# Patient Record
Sex: Female | Born: 1990 | Race: White | Hispanic: No | Marital: Married | Smoking: Former smoker
Health system: Southern US, Community
[De-identification: ages and names within clinical notes are randomized; demographics above are authoritative.]

## PROBLEM LIST (undated history)

## (undated) DIAGNOSIS — B977 Papillomavirus as the cause of diseases classified elsewhere: Secondary | ICD-10-CM

## (undated) DIAGNOSIS — I10 Essential (primary) hypertension: Secondary | ICD-10-CM

## (undated) DIAGNOSIS — F329 Major depressive disorder, single episode, unspecified: Secondary | ICD-10-CM

## (undated) DIAGNOSIS — F419 Anxiety disorder, unspecified: Secondary | ICD-10-CM

## (undated) DIAGNOSIS — R87629 Unspecified abnormal cytological findings in specimens from vagina: Secondary | ICD-10-CM

## (undated) DIAGNOSIS — F32A Depression, unspecified: Secondary | ICD-10-CM

## (undated) HISTORY — PX: APPENDECTOMY: SHX54

## (undated) HISTORY — DX: Major depressive disorder, single episode, unspecified: F32.9

## (undated) HISTORY — DX: Anxiety disorder, unspecified: F41.9

## (undated) HISTORY — PX: WISDOM TOOTH EXTRACTION: SHX21

## (undated) HISTORY — DX: Essential (primary) hypertension: I10

## (undated) HISTORY — DX: Depression, unspecified: F32.A

## (undated) HISTORY — DX: Papillomavirus as the cause of diseases classified elsewhere: B97.7

## (undated) HISTORY — DX: Unspecified abnormal cytological findings in specimens from vagina: R87.629

---

## 2016-04-18 NOTE — L&D Delivery Note (Signed)
Delivery Note At 3:07 AM a viable female was delivered via Vaginal, Spontaneous Delivery (Presentation:LOA ).  APGAR:7,9. Weight pending.   Placenta status: Delivered intact with gentle traction.  Cord: 3 vessels with the following complications: None. Cord pH: Not collected  Anesthesia: Epidural Episiotomy: None Lacerations: None Suture Repair: N/A Est. Blood Loss (mL): 300  Mom to postpartum.  Baby to Couplet care / Skin to Skin.  Michelle Guerra, PGY-1 08/11/2016, 3:27 AM  Patient is a [id number] at [redacted]w[redacted]d who was admitted for IOL due to pre-e, but otherwise mostly an uncomplicated prenatal course.  She progressed with augmentation via Pit/AROM.  I was gloved and present for delivery in its entirety.  Second stage of labor progressed to SVD.  Mild decels during second stage noted.  Complications: none  Lacerations: none  EBL: 300cc  Michelle Guerra, CNM 4:26 PM

## 2016-07-04 LAB — OB RESULTS CONSOLE GBS: GBS: NEGATIVE

## 2016-07-11 ENCOUNTER — Other Ambulatory Visit (HOSPITAL_COMMUNITY)
Admission: RE | Admit: 2016-07-11 | Discharge: 2016-07-11 | Disposition: A | Discharge status: Home / Self Care | Payer: Medicaid Other | Source: Ambulatory Visit | Attending: Advanced Practice Midwife | Admitting: Advanced Practice Midwife

## 2016-07-11 ENCOUNTER — Ambulatory Visit (INDEPENDENT_AMBULATORY_CARE_PROVIDER_SITE_OTHER): Payer: Medicaid Other | Admitting: Advanced Practice Midwife

## 2016-07-11 ENCOUNTER — Encounter: Payer: Self-pay | Admitting: Advanced Practice Midwife

## 2016-07-11 ENCOUNTER — Encounter: Payer: Self-pay | Admitting: *Deleted

## 2016-07-11 VITALS — BP 127/85 | HR 112 | Ht 60.0 in | Wt 129.0 lb

## 2016-07-11 DIAGNOSIS — Z3483 Encounter for supervision of other normal pregnancy, third trimester: Secondary | ICD-10-CM | POA: Diagnosis not present

## 2016-07-11 DIAGNOSIS — Z23 Encounter for immunization: Secondary | ICD-10-CM

## 2016-07-11 DIAGNOSIS — N6012 Diffuse cystic mastopathy of left breast: Secondary | ICD-10-CM

## 2016-07-11 DIAGNOSIS — N6011 Diffuse cystic mastopathy of right breast: Secondary | ICD-10-CM

## 2016-07-11 DIAGNOSIS — O0930 Supervision of pregnancy with insufficient antenatal care, unspecified trimester: Secondary | ICD-10-CM | POA: Insufficient documentation

## 2016-07-11 DIAGNOSIS — O09899 Supervision of other high risk pregnancies, unspecified trimester: Secondary | ICD-10-CM | POA: Insufficient documentation

## 2016-07-11 DIAGNOSIS — O0933 Supervision of pregnancy with insufficient antenatal care, third trimester: Secondary | ICD-10-CM | POA: Diagnosis not present

## 2016-07-11 DIAGNOSIS — Z3689 Encounter for other specified antenatal screening: Secondary | ICD-10-CM | POA: Diagnosis not present

## 2016-07-11 NOTE — Patient Instructions (Signed)
Thinking About Waterbirth???  You must attend a Waterbirth class at [facility]  3rd Wednesday of every month from 7-9pm  Free  Register by calling [phone] or online at [email or web address]  Bring us the certificate from the class  Waterbirth supplies needed for [facility] Department patients:  Our practice has a Birth Pool in a Box tub at the hospital that you can borrow  You will need to purchase an accessory kit that has all needed supplies through [facility] ([phone]) or online $175.00  Or you can purchase the supplies separately: o Single-use disposable tub liner for Birth Pool in a Box (REGULAR size) o New garden hose labeled "lead-free", "suitable for drinking water", o Electric drain pump to remove water (We recommend 792 gallon per hour or greater pump.)  o  "non-toxic" OR "water potable" o Garden hose to remove the dirty water o Fish net o Bathing suit top (optional) o Long-handled mirror (optional)  [employer] sells tubs for ~ $120 if you would rather purchase your own tub.  They also sell accessories, liners.    Www.waterbirthsolutions.com for tub purchases and supplies  [employer] (www.thelaborladies.com) $275 for tub rental/set-up & take down/kit   [employer] information regarding doulas (labor support) who provide pool rentals:  Http://www.[facility]anc.org/MeetUs.htm   [employer] (www.thelaborladies.com)  Http://www.padanc.org/MeetUs.htm   Things that would prevent you from having a waterbirth:  Premature, <37wks  Previous cesarean birth  Presence of thick meconium-stained fluid  Multiple gestation (Twins, triplets, etc.)  Uncontrolled diabetes or gestational diabetes requiring medication  Hypertension  Heavy vaginal bleeding  Non-reassuring fetal heart rate  Active infection (MRSA, etc.)  If your labor has to be induced and induction method requires continuous  monitoring of the baby's heart rate  Other risks/issues identified by your obstetrical provider   

## 2016-07-11 NOTE — Progress Notes (Signed)
   PRENATAL VISIT NOTE  Subjective:  Michelle Guerra is a 26 y.o. G4P3003 at 4956w1d being seen today for initial prenatal visit.  She is currently monitored for the following issues for this low-risk pregnancy and has Supervision of normal pregnancy on her problem list.  Patient reports no complaints.  Contractions: Not present. Vag. Bleeding: None.  Movement: Present. Denies leaking of fluid.   The following portions of the patient's history were reviewed and updated as appropriate: allergies, current medications, past family history, past medical history, past social history, past surgical history and problem list. Problem list updated.  Objective:   Vitals:   07/11/16 0923 07/11/16 0927  BP: 127/85   Pulse: (!) 112   Weight: 129 lb (58.5 kg)   Height:  5' (1.524 m)    Fetal Status: Fetal Heart Rate (bpm): 145   Movement: Present     VS reviewed, nursing note reviewed,  Constitutional: well developed, well nourished, no distress HEENT: normocephalic CV: normal rate Pulm/chest wall: normal effort Breast Exam:  right breast normal with multiple small smooth round mobile masses c/w fibrocystic breasts, skin or nipple changes or axillary nodes, left breast normal without mass, skin or nipple changes or axillary nodes Abdomen: soft Neuro: alert and oriented x 3 Skin: warm, dry Psych: affect normal Pelvic exam: Cervix pink, visually closed, without lesion, scant white creamy discharge, vaginal walls and external genitalia normal Bimanual exam: Cervix 0/long/high, firm, anterior, neg CMT, uterus nontender, nonenlarged, adnexa without tenderness, enlargement, or mass  Assessment and Plan:  Pregnancy: G4P3003 at 5356w1d  1. Encounter for supervision of other normal pregnancy in third trimester  - Prenatal Profile - Culture, OB Urine - Cytology - PAP - Tdap vaccine greater than or equal to 7yo IM - US MFM OB COMP + 14 WK; Future  2. Late prenatal care  - US MFM OB COMP + 14 WK;  Future  3. Fibrocystic breast changes of both breasts --Pt not aware of breast cysts, never had diagnosis of fibrocystic breasts. No tenderness or other breast problems.   --Discussed with pt, recommend decreased caffeine.   Recommend regular breast self exams, pt to report if any suspicious or persistent changes.  Preterm labor symptoms and general obstetric precautions including but not limited to vaginal bleeding, contractions, leaking of fluid and fetal movement were reviewed in detail with the patient. Please refer to After Visit Summary for other counseling recommendations.  Return in about 2 weeks (around 07/25/2016).   Hurshel PartyLisa A Leftwich-Kirby, CNM

## 2016-07-12 LAB — CYTOLOGY - PAP
Chlamydia: NEGATIVE
DIAGNOSIS: NEGATIVE
NEISSERIA GONORRHEA: NEGATIVE

## 2016-07-12 LAB — PRENATAL PROFILE (SOLSTAS)
ANTIBODY SCREEN: NEGATIVE
BASOS ABS: 0 {cells}/uL (ref 0–200)
Basophils Relative: 0 %
EOS PCT: 1 %
Eosinophils Absolute: 166 cells/uL (ref 15–500)
HCT: 34.1 % — ABNORMAL LOW (ref 35.0–45.0)
HEMOGLOBIN: 12 g/dL (ref 11.7–15.5)
HIV 1&2 Ab, 4th Generation: NONREACTIVE
Hepatitis B Surface Ag: NEGATIVE
Lymphocytes Relative: 13 %
Lymphs Abs: 2158 cells/uL (ref 850–3900)
MCH: 31.1 pg (ref 27.0–33.0)
MCHC: 35.2 g/dL (ref 32.0–36.0)
MCV: 88.3 fL (ref 80.0–100.0)
MONOS PCT: 5 %
MPV: 10.6 fL (ref 7.5–12.5)
Monocytes Absolute: 830 cells/uL (ref 200–950)
Neutro Abs: 13446 cells/uL — ABNORMAL HIGH (ref 1500–7800)
Neutrophils Relative %: 81 %
Platelets: 346 10*3/uL (ref 140–400)
RBC: 3.86 MIL/uL (ref 3.80–5.10)
RDW: 13.9 % (ref 11.0–15.0)
Rh Type: POSITIVE
WBC: 16.6 10*3/uL — AB (ref 3.8–10.8)

## 2016-07-13 LAB — CULTURE, OB URINE

## 2016-07-14 ENCOUNTER — Other Ambulatory Visit: Payer: Medicaid Other

## 2016-07-14 DIAGNOSIS — O0930 Supervision of pregnancy with insufficient antenatal care, unspecified trimester: Secondary | ICD-10-CM

## 2016-07-15 ENCOUNTER — Ambulatory Visit (HOSPITAL_COMMUNITY)
Admission: RE | Admit: 2016-07-15 | Discharge: 2016-07-15 | Disposition: A | Discharge status: Home / Self Care | Payer: Medicaid Other | Source: Ambulatory Visit | Attending: Advanced Practice Midwife | Admitting: Advanced Practice Midwife

## 2016-07-15 ENCOUNTER — Other Ambulatory Visit: Payer: Self-pay | Admitting: Advanced Practice Midwife

## 2016-07-15 DIAGNOSIS — Z3687 Encounter for antenatal screening for uncertain dates: Secondary | ICD-10-CM

## 2016-07-15 DIAGNOSIS — O09293 Supervision of pregnancy with other poor reproductive or obstetric history, third trimester: Secondary | ICD-10-CM | POA: Insufficient documentation

## 2016-07-15 DIAGNOSIS — Z3A33 33 weeks gestation of pregnancy: Secondary | ICD-10-CM | POA: Insufficient documentation

## 2016-07-15 DIAGNOSIS — O0930 Supervision of pregnancy with insufficient antenatal care, unspecified trimester: Secondary | ICD-10-CM

## 2016-07-15 DIAGNOSIS — Z3689 Encounter for other specified antenatal screening: Secondary | ICD-10-CM

## 2016-07-15 DIAGNOSIS — O0933 Supervision of pregnancy with insufficient antenatal care, third trimester: Secondary | ICD-10-CM | POA: Diagnosis not present

## 2016-07-15 DIAGNOSIS — O09299 Supervision of pregnancy with other poor reproductive or obstetric history, unspecified trimester: Secondary | ICD-10-CM

## 2016-07-15 DIAGNOSIS — Z8632 Personal history of gestational diabetes: Secondary | ICD-10-CM

## 2016-07-15 DIAGNOSIS — Z3483 Encounter for supervision of other normal pregnancy, third trimester: Secondary | ICD-10-CM

## 2016-07-15 LAB — GLUCOSE TOLERANCE, 2 HOURS W/ 1HR
Glucose, 1 hour: 155 mg/dL
Glucose, 2 hour: 121 mg/dL (ref <140)
Glucose, Fasting: 78 mg/dL (ref 65–99)

## 2016-07-15 IMAGING — US US MFM OB COMP +14 WKS
1 series · 14 of 28 positions shown · non-contrast
Comparison: none

[Series 1: us mfm ob comp +14 wks · 82 acquisitions, 14 frames shown]
[im 4/82]
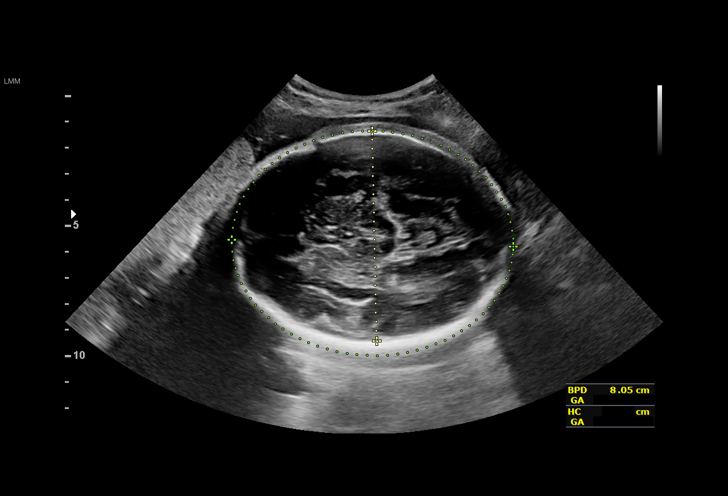
[im 10/82]
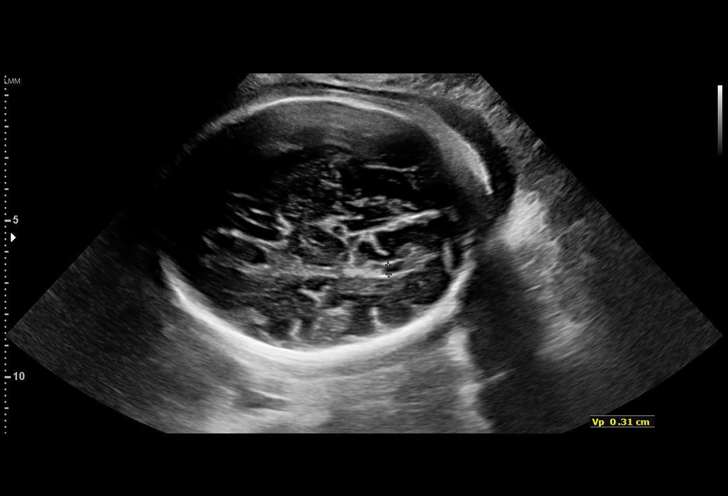
[im 16/82]
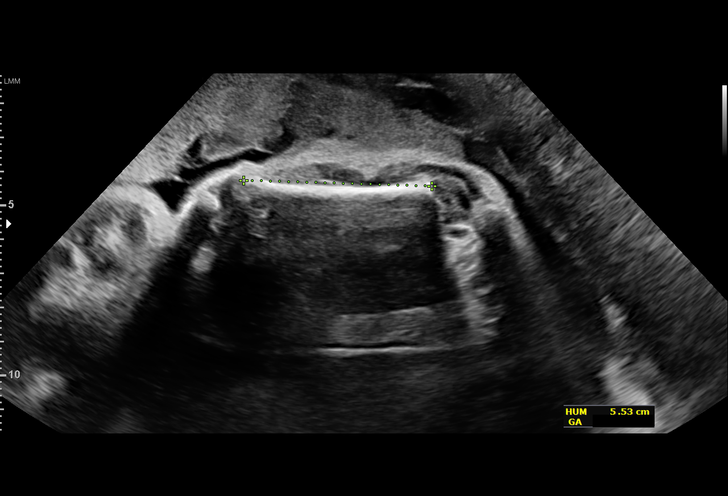
[im 22/82]
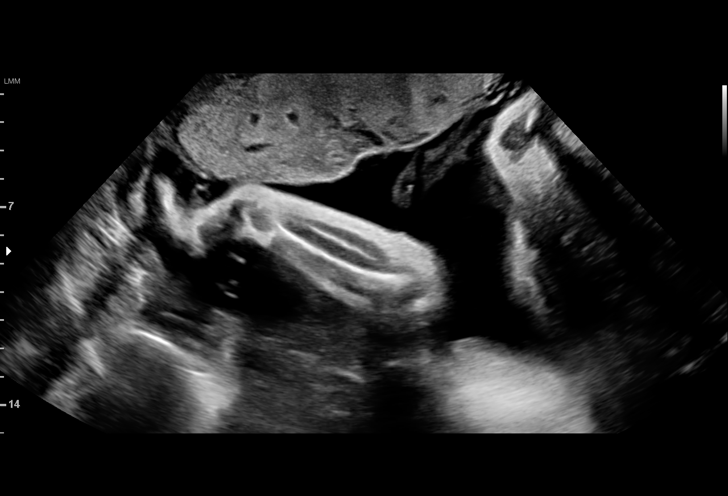
[im 28/82]
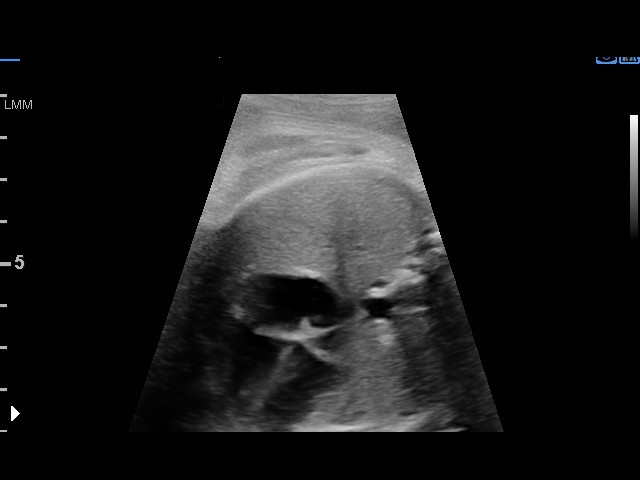
[im 34/82]
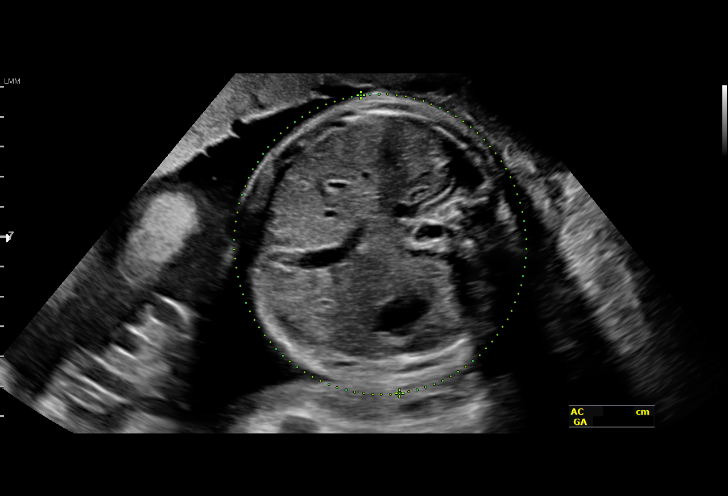
[im 40/82]
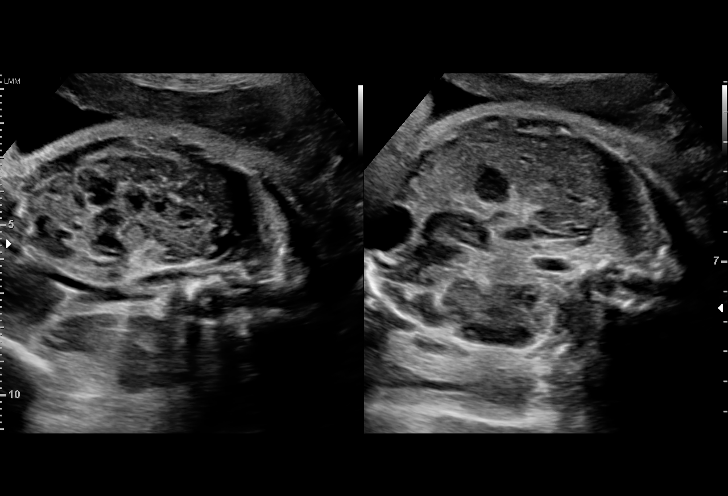
[im 46/82]
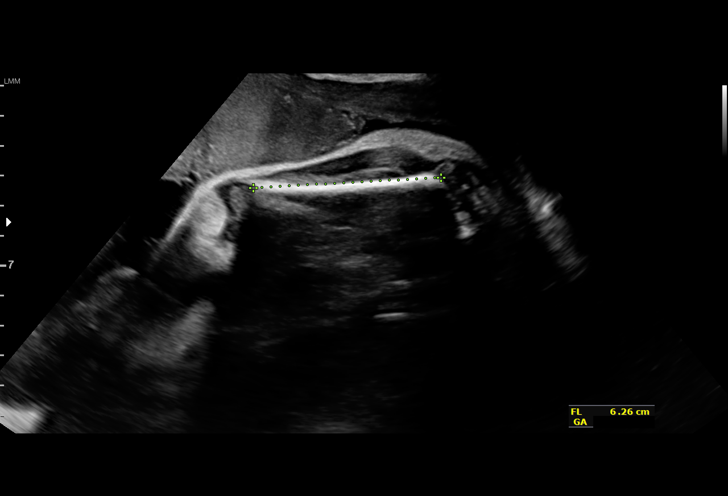
[im 52/82]
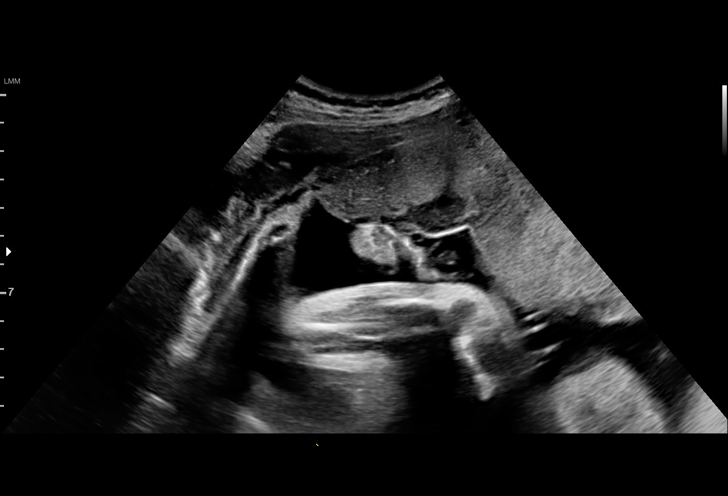
[im 58/82]
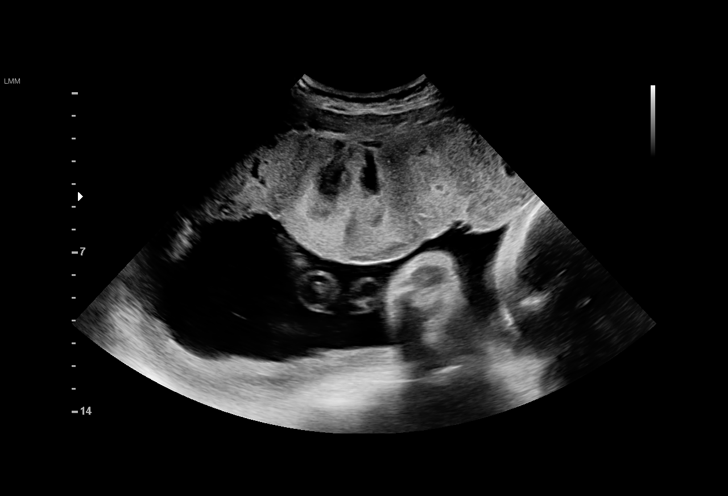
[im 64/82]
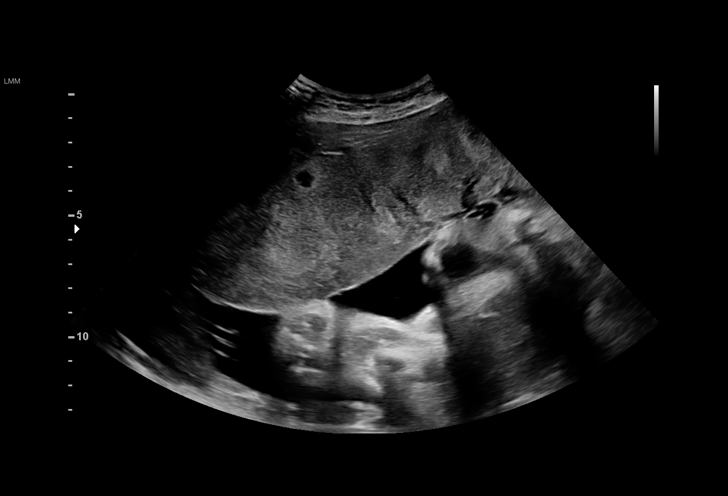
[im 70/82]
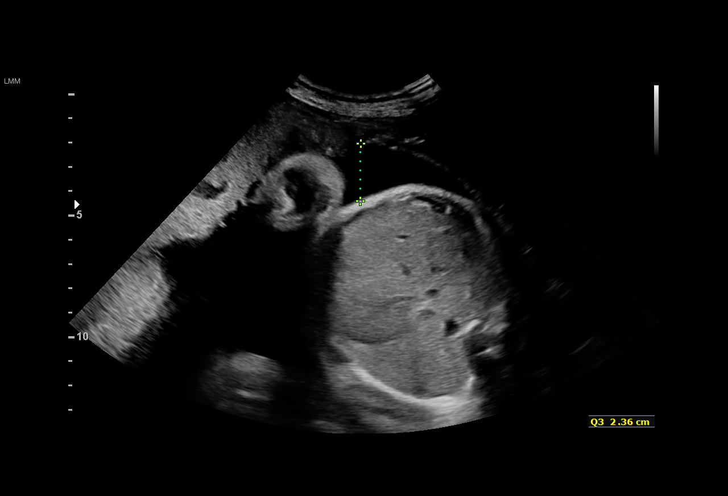
[im 76/82]
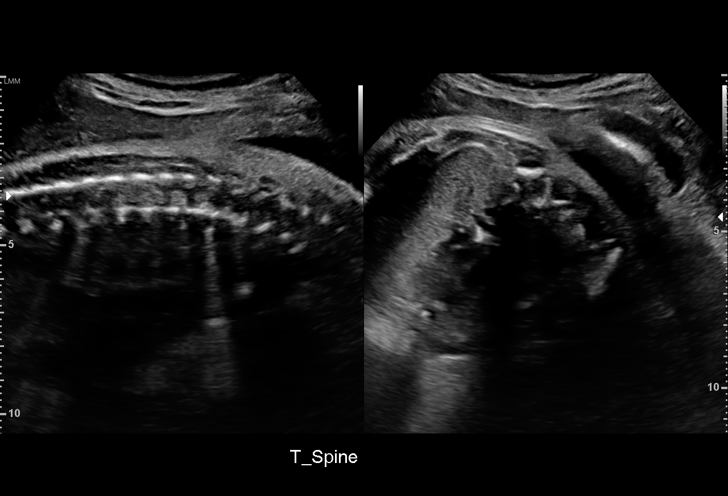
[im 82/82]
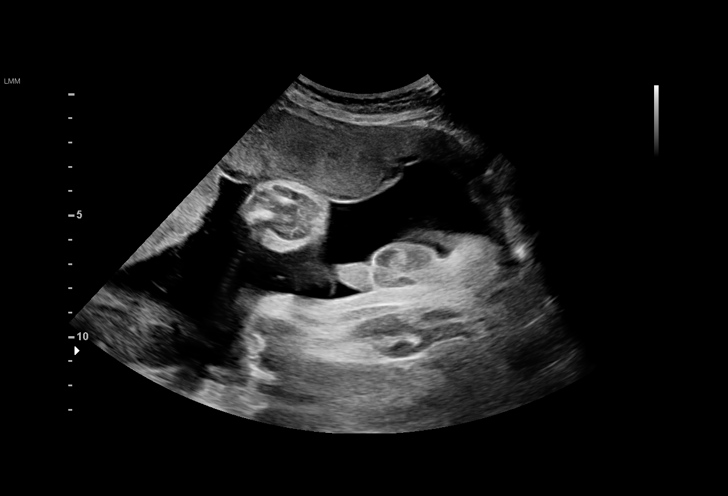

[14 of 28 positions shown; findings below may reference images not displayed]

Indications

33 weeks gestation of pregnancy
Encounter for fetal anatomic survey            [RB]
Encounter for uncertain dates                  [RB]
Poor obstetric history: Previous gestational   [RB]
diabetes (nl 2 hour GTT)
Late to prenatal care, third trimester         [RB]
Fetal Evaluation

Num Of Fetuses:     1
Fetal Heart         146
Rate(bpm):
Cardiac Activity:   Observed
Presentation:       Cephalic
Placenta:           Anterior, above cervical os
P. Cord Insertion:  Visualized, central

Amniotic Fluid
AFI FV:      Subjectively within normal limits

AFI Sum(cm)     %Tile       Largest Pocket(cm)
21.2            80

RUQ(cm)       RLQ(cm)       LUQ(cm)        LLQ(cm)
4.9
Biometry

BPD:        80  mm     G. Age:  32w 1d         15  %    CI:        70.64   %   70 - 86
FL/HC:      20.7   %   19.9 -
HC:      303.4  mm     G. Age:  33w 5d         24  %    HC/AC:      0.99       0.96 -
AC:      307.7  mm     G. Age:  34w 5d         87  %    FL/BPD:     78.4   %   71 - 87
FL:       62.7  mm     G. Age:  32w 3d         20  %    FL/AC:      20.4   %   20 - 24
HUM:      55.2  mm     G. Age:  32w 1d         35  %
CER:      40.1  mm     G. Age:  34w 1d         59  %
CM:        5.5  mm

Est. FW:    [RB]  gm          5 lb      68  %
Gestational Age

U/S Today:     33w 2d                                        EDD:   [DATE]
Best:          33w 2d    Det. By:   U/S ([DATE])           EDD:   [DATE]
Anatomy

Cranium:               Appears normal         Aortic Arch:            Not well visualized
Cavum:                 Appears normal         Ductal Arch:            Not well visualized
Ventricles:            Appears normal         Diaphragm:              Appears normal
Choroid Plexus:        Not well visualized    Stomach:                Appears normal, left
sided
Cerebellum:            Appears normal         Abdomen:                Appears normal
Posterior Fossa:       Appears normal         Abdominal Wall:         Appears nml (cord
insert, abd wall)
Nuchal Fold:           Not applicable (>20    Cord Vessels:           Appears normal (3
wks GA)                                        vessel cord)
Face:                  Orbits appear          Kidneys:                Appear normal
normal
Lips:                  Appears normal         Bladder:                Appears normal
Thoracic:              Appears normal         Spine:                  Limited views
appear normal
Heart:                 Appears normal         Upper Extremities:      Visualized
(4CH, axis, and situs
RVOT:                  Not well visualized    Lower Extremities:      Visualized
LVOT:                  Appears normal

Other:  Technically difficult due to advanced gestational age and fetal
position.  PATIENT DOES NOT WANT TO KNOW GENDER. Fetus
appears to be a male.
Cervix Uterus Adnexa

Cervix
Not visualized (advanced GA >[RB])

Uterus
No abnormality visualized.

Left Ovary
Not visualized.

Right Ovary
Not visualized.

Adnexa:       No abnormality visualized.
Impression

Single IUP at 33w 2d
Limited views of the fetal heart and spine obtained due to late
gestational age and fetal position
The remainder of the fetal anatomy appears normal
The estimated fetal weight is at the 68th %tile
Anterior placenta without previa
Normal amniotic fluid volume
Recommendations

Follow-up ultrasounds as clinically indicated.

## 2016-07-18 ENCOUNTER — Telehealth: Payer: Self-pay

## 2016-07-18 NOTE — Telephone Encounter (Signed)
Left message on pt's phone letting her know that she passed her GTT

## 2016-07-29 ENCOUNTER — Encounter: Payer: Self-pay | Admitting: Advanced Practice Midwife

## 2016-07-29 ENCOUNTER — Ambulatory Visit (INDEPENDENT_AMBULATORY_CARE_PROVIDER_SITE_OTHER): Payer: Medicaid Other | Admitting: Advanced Practice Midwife

## 2016-07-29 ENCOUNTER — Other Ambulatory Visit (HOSPITAL_COMMUNITY)
Admission: RE | Admit: 2016-07-29 | Discharge: 2016-07-29 | Disposition: A | Discharge status: Home / Self Care | Payer: Medicaid Other | Source: Ambulatory Visit | Attending: Advanced Practice Midwife | Admitting: Advanced Practice Midwife

## 2016-07-29 VITALS — BP 146/94 | HR 92 | Wt 131.0 lb

## 2016-07-29 DIAGNOSIS — O26899 Other specified pregnancy related conditions, unspecified trimester: Secondary | ICD-10-CM | POA: Diagnosis present

## 2016-07-29 DIAGNOSIS — O10913 Unspecified pre-existing hypertension complicating pregnancy, third trimester: Secondary | ICD-10-CM | POA: Diagnosis not present

## 2016-07-29 DIAGNOSIS — O0933 Supervision of pregnancy with insufficient antenatal care, third trimester: Secondary | ICD-10-CM

## 2016-07-29 DIAGNOSIS — N898 Other specified noninflammatory disorders of vagina: Secondary | ICD-10-CM

## 2016-07-29 DIAGNOSIS — O09893 Supervision of other high risk pregnancies, third trimester: Secondary | ICD-10-CM | POA: Diagnosis not present

## 2016-07-29 DIAGNOSIS — Z3A00 Weeks of gestation of pregnancy not specified: Secondary | ICD-10-CM | POA: Diagnosis not present

## 2016-07-29 DIAGNOSIS — O10919 Unspecified pre-existing hypertension complicating pregnancy, unspecified trimester: Secondary | ICD-10-CM

## 2016-07-29 DIAGNOSIS — O09899 Supervision of other high risk pregnancies, unspecified trimester: Secondary | ICD-10-CM

## 2016-07-29 DIAGNOSIS — Z3A33 33 weeks gestation of pregnancy: Secondary | ICD-10-CM

## 2016-07-29 NOTE — Progress Notes (Signed)
   PRENATAL VISIT NOTE  Subjective:  Michelle Guerra is a 26 y.o. G4P3003 at [redacted]w[redacted]d(changed based on anatomy [place] 3[place]) being seen today for ongoing prenatal care.  She is currently monitored for the following issues for this high-risk pregnancy and has Supervision of other high risk pregnancy, antepartum and Late prenatal care on her problem list.  Patient reports occasional contractions and pelvic pressure.  Contractions: Irreg. Vag. Bleeding: None.  Movement: Present. Denies leaking of fluid, HA, vision changes or epigastric pain.   The following portions of the patient's history were reviewed and updated as appropriate: allergies, current medications, past family history, past medical history, past social history, past surgical history and problem list. Problem list updated.  Has not started antenatal testing due to recent change in EDD.   Objective:   Vitals:   07/29/16 0824  BP: (!) 146/94  Pulse: 92  Weight: 131 lb (59.4 kg)    Fetal Status: Fetal Heart Rate (bpm): 155 Fundal Height: 34 cm Movement: Present  Presentation: Vertex  General:  Alert, oriented and cooperative. Patient is in no acute distress.  Skin: Skin is warm and dry. No rash noted.   Cardiovascular: Normal heart rate noted  Respiratory: Normal respiratory effort, no problems with respiration noted  Abdomen: Soft, gravid, appropriate for gestational age. Pain/Pressure: Present     Pelvic:  Cervical exam performed Dilation: Fingertip Effacement (%): 0 Station: Ballotable  Extremities: Normal range of motion.  Edema: None  Mental Status: Normal mood and affect. Normal behavior. Normal judgment and thought content.   NST reactive  Assessment and Plan:  Pregnancy: G4P3003 at 353w2d1. Chronic hypertension during pregnancy, antepartum  - CBC - Comp Met (CMET) - Protein / creatinine ratio, urine - [place] FOLLOW UP; Future - Discussed possibly needing to start BP meds. Pt wants to wait for now, which I  think is appropriate - Pre-E precautions.  2. Limited prenatal care in third trimester  - [place] OB FOLLOW UP; Future  3. [redacted] weeks gestation of pregnancy  - [place] OB FOLLOW UP; Future  4. Supervision of other high risk pregnancy, antepartum   Preterm labor symptoms and general obstetric precautions including but not limited to vaginal bleeding, contractions, leaking of fluid and fetal movement were reviewed in detail with the patient. Please refer to After Visit Summary for other counseling recommendations.  Return for Start twice weekly testing.   ViManya SilvasCNM

## 2016-07-29 NOTE — Progress Notes (Signed)
First BP reading on right arm 146/94. Repeat BP on left arm 144/101.

## 2016-07-29 NOTE — Patient Instructions (Signed)
Hypertension During Pregnancy °Hypertension, commonly called high blood pressure, is when the force of blood pumping through your arteries is too strong. Arteries are blood vessels that carry blood from the heart throughout the body. Hypertension during pregnancy can cause problems for you and your baby. Your baby may be born early (prematurely) or may not weigh as much as he or she should at birth. Very bad cases of hypertension during pregnancy can be life-threatening. °Different types of hypertension can occur during pregnancy. These include: °· Chronic hypertension. This happens when: °¨ You have hypertension before pregnancy and it continues during pregnancy. °¨ You develop hypertension before you are [redacted] weeks pregnant, and it continues during pregnancy. °· Gestational hypertension. This is hypertension that develops after the 20th week of pregnancy. °· Preeclampsia, also called toxemia of pregnancy. This is a very serious type of hypertension that develops only during pregnancy. It affects the whole body, and it can be very dangerous for you and your baby. °Gestational hypertension and preeclampsia usually go away within 6 weeks after your baby is born. Women who have hypertension during pregnancy have a greater chance of developing hypertension later in life or during future pregnancies. °What are the causes? °The exact cause of hypertension is not known. °What increases the risk? °There are certain factors that make it more likely for you to develop hypertension during pregnancy. These include: °· Having hypertension during a previous pregnancy or prior to pregnancy. °· Being overweight. °· Being older than age 40. °· Being pregnant for the first time or being pregnant with more than one baby. °· Becoming pregnant using fertilization methods such as IVF (in vitro fertilization). °· Having diabetes, kidney problems, or systemic lupus erythematosus. °· Having a family history of hypertension. °What are the  signs or symptoms? °Chronic hypertension and gestational hypertension rarely cause symptoms. Preeclampsia causes symptoms, which may include: °· Increased protein in your urine. Your health care provider will check for this at every visit before you give birth (prenatal visit). °· Severe headaches. °· Sudden weight gain. °· Swelling of the hands, face, legs, and feet. °· Nausea and vomiting. °· Vision problems, such as blurred or double vision. °· Numbness in the face, arms, legs, and feet. °· Dizziness. °· Slurred speech. °· Sensitivity to bright lights. °· Abdominal pain. °· Convulsions. °How is this diagnosed? °You may be diagnosed with hypertension during a routine prenatal exam. At each prenatal visit, you may: °· Have a urine test to check for high amounts of protein in your urine. °· Have your blood pressure checked. A blood pressure reading is recorded as two numbers, such as "120 over 80" (or 120/80). The first ("top") number is called the systolic pressure. It is a measure of the pressure in your arteries when your heart beats. The second ("bottom") number is called the diastolic pressure. It is a measure of the pressure in your arteries as your heart relaxes between beats. Blood pressure is measured in a unit called mm Hg. A normal blood pressure reading is: °¨ Systolic: below 120. °¨ Diastolic: below 80. °The type of hypertension that you are diagnosed with depends on your test results and when your symptoms developed. °· Chronic hypertension is usually diagnosed before 20 weeks of pregnancy. °· Gestational hypertension is usually diagnosed after 20 weeks of pregnancy. °· Hypertension with high amounts of protein in the urine is diagnosed as preeclampsia. °· Blood pressure measurements that stay above 160 systolic, or above 110 diastolic, are signs of severe preeclampsia. °  How is this treated? Treatment for hypertension during pregnancy varies depending on the type of hypertension you have and how  serious it is.  If you take medicines called ACE inhibitors to treat chronic hypertension, you may need to switch medicines. ACE inhibitors should not be taken during pregnancy.  If you have gestational hypertension, you may need to take blood pressure medicine.  If you are at risk for preeclampsia, your health care provider may recommend that you take a low-dose aspirin every day to prevent high blood pressure during your pregnancy.  If you have severe preeclampsia, you may need to be hospitalized so you and your baby can be monitored closely. You may also need to take medicine (magnesium sulfate) to prevent seizures and to lower blood pressure. This medicine may be given as an injection or through an IV tube.  In some cases, if your condition gets worse, you may need to deliver your baby early. Follow these instructions at home: Eating and drinking   Drink enough fluid to keep your urine clear or pale yellow.  Eat a healthy diet that is low in salt (sodium). Do not add salt to your food. Check food labels to see how much sodium a food or beverage contains. Lifestyle   Do not use any products that contain nicotine or tobacco, such as cigarettes and e-cigarettes. If you need help quitting, ask your health care provider.  Do not use alcohol.  Avoid caffeine.  Avoid stress as much as possible. Rest and get plenty of sleep. General instructions   Take over-the-counter and prescription medicines only as told by your health care provider.  While lying down, lie on your left side. This keeps pressure off your baby.  While sitting or lying down, raise (elevate) your feet. Try putting some pillows under your lower legs.  Exercise regularly. Ask your health care provider what kinds of exercise are best for you.  Keep all prenatal and follow-up visits as told by your health care provider. This is important. Contact a health care provider if:  You have symptoms that your health care  provider told you may require more treatment or monitoring, such as:  Fever.  Vomiting.  Headache. Get help right away if:  You have severe abdominal pain or vomiting that does not get better with treatment.  You suddenly develop swelling in your hands, ankles, or face.  You gain 4 lbs (1.8 kg) or more in 1 week.  You develop vaginal bleeding, or you have blood in your urine.  You do not feel your baby moving as much as usual.  You have blurred or double vision.  You have muscle twitching or sudden tightening (spasms).  You have shortness of breath.  Your lips or fingernails turn blue. This information is not intended to replace advice given to you by your health care provider. Make sure you discuss any questions you have with your health care provider. Document Released: 12/21/2010 Document Revised: 10/23/2015 Document Reviewed: 09/18/2015 Elsevier Interactive Patient Education  2017 [employer]   [employer] of the uterus can occur throughout pregnancy, but they are not always a sign that you are in labor. You may have practice contractions called Braxton Hicks contractions. These false labor contractions are sometimes confused with true labor. What are Deberah Pelton contractions? Braxton Hicks contractions are tightening movements that occur in the muscles of the uterus before labor. Unlike true labor contractions, these contractions do not result in opening (dilation) and thinning of  the cervix. Toward the end of pregnancy (32-34 weeks), Braxton Hicks contractions can happen more often and may become stronger. These contractions are sometimes difficult to tell apart from true labor because they can be very uncomfortable. You should not feel embarrassed if you go to the hospital with false labor. Sometimes, the only way to tell if you are in true labor is for your health care provider to look for changes in the cervix. The health care provider will  do a physical exam and may monitor your contractions. If you are not in true labor, the exam should show that your cervix is not dilating and your water has not broken. If there are no prenatal problems or other health problems associated with your pregnancy, it is completely safe for you to be sent home with false labor. You may continue to have Braxton Hicks contractions until you go into true labor. How can I tell the difference between true labor and false labor?  Differences  False labor  Contractions last 30-70 seconds.: Contractions are usually shorter and not as strong as true labor contractions.  Contractions become very regular.: Contractions are usually irregular.  Discomfort is usually felt in the top of the uterus, and it spreads to the lower abdomen and low back.: Contractions are often felt in the front of the lower abdomen and in the groin.  Contractions do not go away with walking.: Contractions may go away when you walk around or change positions while lying down.  Contractions usually become more intense and increase in frequency.: Contractions get weaker and are shorter-lasting as time goes on.  The cervix dilates and gets thinner.: The cervix usually does not dilate or become thin. Follow these instructions at home:  Take over-the-counter and prescription medicines only as told by your health care provider.  Keep up with your usual exercises and follow other instructions from your health care provider.  Eat and drink lightly if you think you are going into labor.  If Braxton Hicks contractions are making you uncomfortable:  Change your position from lying down or resting to walking, or change from walking to resting.  Sit and rest in a tub of warm water.  Drink enough fluid to keep your urine clear or pale yellow. Dehydration may cause these contractions.  Do slow and deep breathing several times an hour.  Keep all follow-up prenatal visits as told by your  health care provider. This is important. Contact a health care provider if:  You have a fever.  You have continuous pain in your abdomen. Get help right away if:  Your contractions become stronger, more regular, and closer together.  You have fluid leaking or gushing from your vagina.  You pass blood-tinged mucus (bloody show).  You have bleeding from your vagina.  You have low back pain that you never had before.  You feel your baby's head pushing down and causing pelvic pressure.  Your baby is not moving inside you as much as it used to. Summary  Contractions that occur before labor are called Braxton Hicks contractions, false labor, or practice contractions.  Braxton Hicks contractions are usually shorter, weaker, farther apart, and less regular than true labor contractions. True labor contractions usually become progressively stronger and regular and they become more frequent.  Manage discomfort from [facility] contractions by changing position, resting in a warm bath, drinking plenty of water, or practicing deep breathing. This information is not intended to replace advice given to you by your health  care provider. Make sure you discuss any questions you have with your health care provider. Document Released: 04/04/2005 Document Revised: 02/22/2016 Document Reviewed: 02/22/2016 Elsevier Interactive Patient Education  2017 [employer].

## 2016-07-30 LAB — COMPREHENSIVE METABOLIC PANEL
ALBUMIN: 3.1 g/dL — AB (ref 3.6–5.1)
ALT: 8 U/L (ref 6–29)
AST: 15 U/L (ref 10–30)
Alkaline Phosphatase: 113 U/L (ref 33–115)
BILIRUBIN TOTAL: 0.3 mg/dL (ref 0.2–1.2)
BUN: 4 mg/dL — AB (ref 7–25)
CO2: 20 mmol/L (ref 20–31)
CREATININE: 0.8 mg/dL (ref 0.50–1.10)
Calcium: 8.3 mg/dL — ABNORMAL LOW (ref 8.6–10.2)
Chloride: 106 mmol/L (ref 98–110)
GLUCOSE: 99 mg/dL (ref 65–99)
Potassium: 3.2 mmol/L — ABNORMAL LOW (ref 3.5–5.3)
SODIUM: 137 mmol/L (ref 135–146)
Total Protein: 5.7 g/dL — ABNORMAL LOW (ref 6.1–8.1)

## 2016-07-30 LAB — CBC
HCT: 35.1 % (ref 35.0–45.0)
HEMOGLOBIN: 11.8 g/dL (ref 11.7–15.5)
MCH: 29.6 pg (ref 27.0–33.0)
MCHC: 33.6 g/dL (ref 32.0–36.0)
MCV: 88 fL (ref 80.0–100.0)
MPV: 10.1 fL (ref 7.5–12.5)
PLATELETS: 330 10*3/uL (ref 140–400)
RBC: 3.99 MIL/uL (ref 3.80–5.10)
RDW: 14.1 % (ref 11.0–15.0)
WBC: 17 10*3/uL — AB (ref 3.8–10.8)

## 2016-07-30 LAB — PROTEIN / CREATININE RATIO, URINE
Creatinine, Urine: 26 mg/dL (ref 20–320)
Protein Creatinine Ratio: 423 mg/g creat — ABNORMAL HIGH (ref 21–161)
Total Protein, Urine: 11 mg/dL (ref 5–24)

## 2016-07-31 LAB — URINE CULTURE: Organism ID, Bacteria: NO GROWTH

## 2016-08-01 ENCOUNTER — Telehealth: Payer: Self-pay | Admitting: Advanced Practice Midwife

## 2016-08-01 LAB — CERVICOVAGINAL ANCILLARY ONLY
Bacterial vaginitis: NEGATIVE
Candida vaginitis: NEGATIVE

## 2016-08-01 NOTE — Telephone Encounter (Signed)
Pt calling because she thought the nurse told her that she was supposed to start BP medication after last visit. CNM discussed that although BP had risen and pt and CNM had discussed BP meds, the pt preference at that time was to not start, which was reasonable for only mildly elevated BP's of 140's/90's. CNM also reviewed elevated PCR giving her Dx Pre-E w/ out severe features and usual recommendation for IOL at 37 weeks due to risk of seizures, abruption. Pt states she does not want to be induced. Can discuss at appt tomorrow w/ Dr. Marice Potter. Pre-E precautions reviewed.

## 2016-08-02 ENCOUNTER — Ambulatory Visit (INDEPENDENT_AMBULATORY_CARE_PROVIDER_SITE_OTHER): Payer: Medicaid Other | Admitting: Obstetrics & Gynecology

## 2016-08-02 ENCOUNTER — Other Ambulatory Visit (HOSPITAL_COMMUNITY)
Admission: RE | Admit: 2016-08-02 | Discharge: 2016-08-02 | Disposition: A | Discharge status: Home / Self Care | Payer: Medicaid Other | Source: Ambulatory Visit | Attending: Obstetrics & Gynecology | Admitting: Obstetrics & Gynecology

## 2016-08-02 VITALS — BP 126/89 | HR 86 | Wt 132.0 lb

## 2016-08-02 DIAGNOSIS — O09899 Supervision of other high risk pregnancies, unspecified trimester: Secondary | ICD-10-CM

## 2016-08-02 DIAGNOSIS — O1493 Unspecified pre-eclampsia, third trimester: Secondary | ICD-10-CM

## 2016-08-02 DIAGNOSIS — O0933 Supervision of pregnancy with insufficient antenatal care, third trimester: Secondary | ICD-10-CM | POA: Diagnosis not present

## 2016-08-02 DIAGNOSIS — Z3A00 Weeks of gestation of pregnancy not specified: Secondary | ICD-10-CM | POA: Insufficient documentation

## 2016-08-02 DIAGNOSIS — O0930 Supervision of pregnancy with insufficient antenatal care, unspecified trimester: Secondary | ICD-10-CM | POA: Insufficient documentation

## 2016-08-02 DIAGNOSIS — O09893 Supervision of other high risk pregnancies, third trimester: Secondary | ICD-10-CM

## 2016-08-02 DIAGNOSIS — O1490 Unspecified pre-eclampsia, unspecified trimester: Secondary | ICD-10-CM | POA: Insufficient documentation

## 2016-08-02 LAB — OB RESULTS CONSOLE GC/CHLAMYDIA: Gonorrhea: NEGATIVE

## 2016-08-02 NOTE — Progress Notes (Signed)
   PRENATAL VISIT NOTE  Subjective:  Michelle Guerra is a 26 y.o. G4P3003 at [redacted]w[redacted]d being seen today for ongoing prenatal care.  She is currently monitored for the following issues for this high-risk pregnancy and has Supervision of other high risk pregnancy, antepartum; Late prenatal care; and Pre-eclampsia on her problem list.  Patient reports no complaints.  Contractions: Not present. Vag. Bleeding: None.  Movement: Present. Denies leaking of fluid.   The following portions of the patient's history were reviewed and updated as appropriate: allergies, current medications, past family history, past medical history, past social history, past surgical history and problem list. Problem list updated.  Objective:   Vitals:   08/02/16 1309  BP: 126/89  Pulse: 86  Weight: 132 lb (59.9 kg)    Fetal Status:     Movement: Present  Presentation: Vertex  General:  Alert, oriented and cooperative. Patient is in no acute distress.  Skin: Skin is warm and dry. No rash noted.   Cardiovascular: Normal heart rate noted  Respiratory: Normal respiratory effort, no problems with respiration noted  Abdomen: Soft, gravid, appropriate for gestational age. Pain/Pressure: Present     Pelvic:  Cervical exam performed        Extremities: Normal range of motion.  Edema: None  Mental Status: Normal mood and affect. Normal behavior. Normal judgment and thought content.   Assessment and Plan:  Pregnancy: G4P3003 at [redacted]w[redacted]d  1. Late prenatal care  - Culture, beta strep (group b only) - Urine cytology ancillary only  2. Supervision of other high risk pregnancy, antepartum   3. Pre-eclampsia in third trimester - She is asymptomatic, her BP is excellent and she is currently declining an IOL at 37 weeks. We have discussed risks and warning signs of pre eclampsia as well as labor precautions. Continue twice weekly testing. She will be taking the tub birth class tonight.  Preterm labor symptoms and general  obstetric precautions including but not limited to vaginal bleeding, contractions, leaking of fluid and fetal movement were reviewed in detail with the patient. Please refer to After Visit Summary for other counseling recommendations.  No Follow-up on file.   Allie Bossier, MD

## 2016-08-04 LAB — CULTURE, BETA STREP (GROUP B ONLY)

## 2016-08-04 LAB — URINE CYTOLOGY ANCILLARY ONLY
CHLAMYDIA, DNA PROBE: NEGATIVE
NEISSERIA GONORRHEA: NEGATIVE

## 2016-08-05 ENCOUNTER — Ambulatory Visit (INDEPENDENT_AMBULATORY_CARE_PROVIDER_SITE_OTHER): Payer: Medicaid Other | Admitting: Family

## 2016-08-05 ENCOUNTER — Encounter: Payer: Self-pay | Admitting: *Deleted

## 2016-08-05 VITALS — BP 142/89 | HR 95 | Wt 132.0 lb

## 2016-08-05 DIAGNOSIS — O1493 Unspecified pre-eclampsia, third trimester: Secondary | ICD-10-CM

## 2016-08-05 DIAGNOSIS — O133 Gestational [pregnancy-induced] hypertension without significant proteinuria, third trimester: Secondary | ICD-10-CM | POA: Diagnosis not present

## 2016-08-05 DIAGNOSIS — O09899 Supervision of other high risk pregnancies, unspecified trimester: Secondary | ICD-10-CM

## 2016-08-05 DIAGNOSIS — O09893 Supervision of other high risk pregnancies, third trimester: Secondary | ICD-10-CM | POA: Diagnosis not present

## 2016-08-05 NOTE — Progress Notes (Signed)
   PRENATAL VISIT NOTE  Subjective:  Michelle Guerra is a 26 y.o. G4P3003 at [redacted]w[redacted]d being seen today for ongoing prenatal care.  She is currently monitored for the following issues for this high-risk pregnancy and has Supervision of other high risk pregnancy, antepartum; Late prenatal care; and Pre-eclampsia on her problem list.  Patient reports no complaints.  Denies symptoms of preeclampsia. Contractions: Not present. Vag. Bleeding: None.  Movement: Present. Denies leaking of fluid.   The following portions of the patient's history were reviewed and updated as appropriate: allergies, current medications, past family history, past medical history, past social history, past surgical history and problem list. Problem list updated.  Objective:   Vitals:   08/05/16 0818  BP: (!) 142/89  Pulse: 95  Weight: 132 lb (59.9 kg)    Fetal Status: Fetal Heart Rate (bpm): NST-R Fundal Height: 36 cm Movement: Present  Presentation: Vertex  General:  Alert, oriented and cooperative. Patient is in no acute distress.  Skin: Skin is warm and dry. No rash noted.   Cardiovascular: Normal heart rate noted  Respiratory: Normal respiratory effort, no problems with respiration noted  Abdomen: Soft, gravid, appropriate for gestational age. Pain/Pressure: Absent     Pelvic:  Cervical exam deferred        Extremities: Normal range of motion.  Edema: None  Mental Status: Normal mood and affect. Normal behavior. Normal judgment and thought content.   Assessment and Plan:  Pregnancy: G4P3003 at [redacted]w[redacted]d  1. Supervision of other high risk pregnancy, antepartum - Reviewed GBS results (negative)  2. Pre-eclampsia in third trimester - Diagnosis based on protein:creatine ratio - Explained not eligible for waterbirth due to diagnosis (extremely disappointed and tearful); already purchased supplies, plans to try to obtain refund from labor ladies - Reviewed wireless monitoring,nitrous as other options - Refuses IOL at  37 weeks  Preterm labor symptoms and general obstetric precautions including but not limited to vaginal bleeding, contractions, leaking of fluid and fetal movement were reviewed in detail with the patient. Please refer to After Visit Summary for other counseling recommendations.   Follow-up visit/NST and AFI next week  Enrica Corliss Kennith Gain, CNM

## 2016-08-09 ENCOUNTER — Ambulatory Visit (INDEPENDENT_AMBULATORY_CARE_PROVIDER_SITE_OTHER): Payer: Medicaid Other | Admitting: Obstetrics & Gynecology

## 2016-08-09 VITALS — BP 135/88 | HR 89 | Wt 133.0 lb

## 2016-08-09 DIAGNOSIS — O09893 Supervision of other high risk pregnancies, third trimester: Secondary | ICD-10-CM

## 2016-08-09 DIAGNOSIS — O1493 Unspecified pre-eclampsia, third trimester: Secondary | ICD-10-CM | POA: Diagnosis not present

## 2016-08-09 DIAGNOSIS — O09899 Supervision of other high risk pregnancies, unspecified trimester: Secondary | ICD-10-CM

## 2016-08-09 DIAGNOSIS — O0933 Supervision of pregnancy with insufficient antenatal care, third trimester: Secondary | ICD-10-CM | POA: Diagnosis not present

## 2016-08-09 DIAGNOSIS — O0930 Supervision of pregnancy with insufficient antenatal care, unspecified trimester: Secondary | ICD-10-CM

## 2016-08-09 NOTE — Progress Notes (Signed)
   PRENATAL VISIT NOTE  Subjective:  Michelle Guerra is a 26 y.o. G4P3003 at [redacted]w[redacted]d being seen today for ongoing prenatal care.  She is currently monitored for the following issues for this high-risk pregnancy and has Supervision of other high risk pregnancy, antepartum; Late prenatal care; and Pre-eclampsia on her problem list.  Patient reports no complaints.  Contractions: Not present. Vag. Bleeding: None.  Movement: Present. Denies leaking of fluid.   The following portions of the patient's history were reviewed and updated as appropriate: allergies, current medications, past family history, past medical history, past social history, past surgical history and problem list. Problem list updated.  Objective:   Vitals:   08/09/16 1320  BP: 135/88  Pulse: 89  Weight: 133 lb (60.3 kg)    Fetal Status:     Movement: Present     General:  Alert, oriented and cooperative. Patient is in no acute distress.  Skin: Skin is warm and dry. No rash noted.   Cardiovascular: Normal heart rate noted  Respiratory: Normal respiratory effort, no problems with respiration noted  Abdomen: Soft, gravid, appropriate for gestational age. Pain/Pressure: Present     Pelvic:  Cervical exam performed        Extremities: Normal range of motion.  Edema: None  Mental Status: Normal mood and affect. Normal behavior. Normal judgment and thought content.   Assessment and Plan:  Pregnancy: G4P3003 at [redacted]w[redacted]d  1. Supervision of other high risk pregnancy, antepartum   2. Pre-eclampsia in third trimester - We discussed the ACOG rec for IOL at 37 weeks. She is not happy with this idea and wants to wait and see what happens in the next few days as she thinks that she is in early labor. She is aware of pre eclampsia symptoms, has none today, and is willing to come to the hospital if any develop.  RTC 3 days for NST/AFI/cervical exam if not already in labor (contractions were seen on NST and palpable during the cervical  exam)  3. Late prenatal care   Preterm labor symptoms and general obstetric precautions including but not limited to vaginal bleeding, contractions, leaking of fluid and fetal movement were reviewed in detail with the patient. Please refer to After Visit Summary for other counseling recommendations.  No Follow-up on file.   Allie Bossier, MD

## 2016-08-10 ENCOUNTER — Encounter (HOSPITAL_COMMUNITY): Payer: Self-pay

## 2016-08-10 ENCOUNTER — Inpatient Hospital Stay (HOSPITAL_COMMUNITY): Payer: Medicaid Other | Admitting: Anesthesiology

## 2016-08-10 ENCOUNTER — Inpatient Hospital Stay (HOSPITAL_COMMUNITY)
Admission: AD | Admit: 2016-08-10 | Discharge: 2016-08-13 | DRG: 767 | Disposition: A | Discharge status: Home / Self Care | Payer: Medicaid Other | Source: Ambulatory Visit | Attending: Obstetrics & Gynecology | Admitting: Obstetrics & Gynecology

## 2016-08-10 DIAGNOSIS — O1494 Unspecified pre-eclampsia, complicating childbirth: Secondary | ICD-10-CM | POA: Diagnosis present

## 2016-08-10 DIAGNOSIS — O1414 Severe pre-eclampsia complicating childbirth: Secondary | ICD-10-CM | POA: Diagnosis not present

## 2016-08-10 DIAGNOSIS — Z3A37 37 weeks gestation of pregnancy: Secondary | ICD-10-CM

## 2016-08-10 DIAGNOSIS — Z8249 Family history of ischemic heart disease and other diseases of the circulatory system: Secondary | ICD-10-CM | POA: Diagnosis not present

## 2016-08-10 DIAGNOSIS — O99344 Other mental disorders complicating childbirth: Secondary | ICD-10-CM | POA: Diagnosis present

## 2016-08-10 DIAGNOSIS — O0930 Supervision of pregnancy with insufficient antenatal care, unspecified trimester: Secondary | ICD-10-CM

## 2016-08-10 DIAGNOSIS — Z302 Encounter for sterilization: Secondary | ICD-10-CM | POA: Diagnosis not present

## 2016-08-10 DIAGNOSIS — Z87891 Personal history of nicotine dependence: Secondary | ICD-10-CM | POA: Diagnosis not present

## 2016-08-10 DIAGNOSIS — O1404 Mild to moderate pre-eclampsia, complicating childbirth: Principal | ICD-10-CM | POA: Diagnosis present

## 2016-08-10 DIAGNOSIS — F418 Other specified anxiety disorders: Secondary | ICD-10-CM | POA: Diagnosis present

## 2016-08-10 DIAGNOSIS — O1490 Unspecified pre-eclampsia, unspecified trimester: Secondary | ICD-10-CM | POA: Diagnosis present

## 2016-08-10 DIAGNOSIS — O1493 Unspecified pre-eclampsia, third trimester: Secondary | ICD-10-CM

## 2016-08-10 DIAGNOSIS — O09899 Supervision of other high risk pregnancies, unspecified trimester: Secondary | ICD-10-CM

## 2016-08-10 LAB — PROTEIN / CREATININE RATIO, URINE: CREATININE, URINE: 21 mg/dL

## 2016-08-10 LAB — COMPREHENSIVE METABOLIC PANEL
ALBUMIN: 2.8 g/dL — AB (ref 3.5–5.0)
ALT: 14 U/L (ref 14–54)
ANION GAP: 9 (ref 5–15)
AST: 20 U/L (ref 15–41)
Alkaline Phosphatase: 129 U/L — ABNORMAL HIGH (ref 38–126)
BILIRUBIN TOTAL: 0.5 mg/dL (ref 0.3–1.2)
BUN: 7 mg/dL (ref 6–20)
CALCIUM: 9.8 mg/dL (ref 8.9–10.3)
CHLORIDE: 105 mmol/L (ref 101–111)
CO2: 21 mmol/L — AB (ref 22–32)
CREATININE: 0.75 mg/dL (ref 0.44–1.00)
GFR calc Af Amer: >60 mL/min (ref >60)
GFR calc non Af Amer: >60 mL/min (ref >60)
Glucose, Bld: 85 mg/dL (ref 65–99)
POTASSIUM: 2.9 mmol/L — AB (ref 3.5–5.1)
SODIUM: 135 mmol/L (ref 135–145)
Total Protein: 6 g/dL — ABNORMAL LOW (ref 6.5–8.1)

## 2016-08-10 LAB — CBC
HCT: 35.3 % — ABNORMAL LOW (ref 36.0–46.0)
HEMOGLOBIN: 12.6 g/dL (ref 12.0–15.0)
MCH: 31 pg (ref 26.0–34.0)
MCHC: 35.7 g/dL (ref 30.0–36.0)
MCV: 86.7 fL (ref 78.0–100.0)
Platelets: 344 10*3/uL (ref 150–400)
RBC: 4.07 MIL/uL (ref 3.87–5.11)
RDW: 13.9 % (ref 11.5–15.5)
WBC: 20.8 10*3/uL — AB (ref 4.0–10.5)

## 2016-08-10 LAB — TYPE AND SCREEN
ABO/RH(D): O POS
Antibody Screen: NEGATIVE

## 2016-08-10 LAB — RAPID URINE DRUG SCREEN, HOSP PERFORMED
AMPHETAMINES: NOT DETECTED
BARBITURATES: NOT DETECTED
BENZODIAZEPINES: NOT DETECTED
COCAINE: NOT DETECTED
OPIATES: NOT DETECTED
TETRAHYDROCANNABINOL: NOT DETECTED

## 2016-08-10 MED ORDER — OXYCODONE-ACETAMINOPHEN 5-325 MG PO TABS: 2.0000 | ORAL_TABLET | ORAL | Status: DC | PRN

## 2016-08-10 MED ORDER — LACTATED RINGERS IV SOLN: 500.0000 mL | INTRAVENOUS | Status: DC | PRN

## 2016-08-10 MED ORDER — ACETAMINOPHEN 325 MG PO TABS: 650.0000 mg | ORAL_TABLET | ORAL | Status: DC | PRN

## 2016-08-10 MED ORDER — FENTANYL 2.5 MCG/ML BUPIVACAINE 1/10 % EPIDURAL INFUSION (WH - ANES)
14.0000 mL/h | INTRAMUSCULAR | Status: DC | PRN
  Administered 2016-08-10 (×2): 14 mL/h via EPIDURAL
  Filled 2016-08-10: qty 100

## 2016-08-10 MED ORDER — POTASSIUM CHLORIDE CRYS ER 20 MEQ PO TBCR
40.0000 meq | EXTENDED_RELEASE_TABLET | Freq: Once | ORAL | Status: AC
  Administered 2016-08-10: 40 meq via ORAL
  Filled 2016-08-10: qty 2

## 2016-08-10 MED ORDER — PHENYLEPHRINE 40 MCG/ML (10ML) SYRINGE FOR IV PUSH (FOR BLOOD PRESSURE SUPPORT)
80.0000 ug | PREFILLED_SYRINGE | INTRAVENOUS | Status: DC | PRN
  Filled 2016-08-10: qty 10

## 2016-08-10 MED ORDER — EPHEDRINE 5 MG/ML INJ: 10.0000 mg | INTRAVENOUS | Status: DC | PRN

## 2016-08-10 MED ORDER — OXYCODONE-ACETAMINOPHEN 5-325 MG PO TABS: 1.0000 | ORAL_TABLET | ORAL | Status: DC | PRN

## 2016-08-10 MED ORDER — OXYTOCIN 40 UNITS IN LACTATED RINGERS INFUSION - SIMPLE MED
1.0000 m[IU]/min | INTRAVENOUS | Status: DC
  Administered 2016-08-10: 2 m[IU]/min via INTRAVENOUS
  Filled 2016-08-10: qty 1000

## 2016-08-10 MED ORDER — LACTATED RINGERS IV SOLN
INTRAVENOUS | Status: DC
  Administered 2016-08-10: 19:00:00 via INTRAVENOUS

## 2016-08-10 MED ORDER — OXYTOCIN BOLUS FROM INFUSION: 500.0000 mL | Freq: Once | INTRAVENOUS | Status: DC

## 2016-08-10 MED ORDER — LIDOCAINE HCL (PF) 1 % IJ SOLN
30.0000 mL | INTRAMUSCULAR | Status: DC | PRN
  Filled 2016-08-10: qty 30

## 2016-08-10 MED ORDER — PHENYLEPHRINE 40 MCG/ML (10ML) SYRINGE FOR IV PUSH (FOR BLOOD PRESSURE SUPPORT): 80.0000 ug | PREFILLED_SYRINGE | INTRAVENOUS | Status: DC | PRN

## 2016-08-10 MED ORDER — OXYTOCIN 40 UNITS IN LACTATED RINGERS INFUSION - SIMPLE MED: 2.5000 [IU]/h | INTRAVENOUS | Status: DC

## 2016-08-10 MED ORDER — OXYTOCIN BOLUS FROM INFUSION
500.0000 mL | Freq: Once | INTRAVENOUS | Status: AC
  Administered 2016-08-11: 500 mL via INTRAVENOUS

## 2016-08-10 MED ORDER — DIPHENHYDRAMINE HCL 50 MG/ML IJ SOLN: 12.5000 mg | INTRAMUSCULAR | Status: DC | PRN

## 2016-08-10 MED ORDER — ONDANSETRON HCL 4 MG/2ML IJ SOLN: 4.0000 mg | Freq: Four times a day (QID) | INTRAMUSCULAR | Status: DC | PRN

## 2016-08-10 MED ORDER — LIDOCAINE HCL (PF) 1 % IJ SOLN
INTRAMUSCULAR | Status: DC | PRN
  Administered 2016-08-10: 4 mL
  Administered 2016-08-10: 2 mL

## 2016-08-10 MED ORDER — LACTATED RINGERS IV SOLN
INTRAVENOUS | Status: DC
  Administered 2016-08-10: 23:00:00 via INTRAVENOUS

## 2016-08-10 MED ORDER — SOD CITRATE-CITRIC ACID 500-334 MG/5ML PO SOLN: 30.0000 mL | ORAL | Status: DC | PRN

## 2016-08-10 MED ORDER — TERBUTALINE SULFATE 1 MG/ML IJ SOLN: 0.2500 mg | Freq: Once | INTRAMUSCULAR | Status: DC | PRN

## 2016-08-10 MED ORDER — LACTATED RINGERS IV SOLN
500.0000 mL | Freq: Once | INTRAVENOUS | Status: AC
  Administered 2016-08-10: 500 mL via INTRAVENOUS

## 2016-08-10 MED ORDER — FENTANYL CITRATE (PF) 100 MCG/2ML IJ SOLN: 100.0000 ug | INTRAMUSCULAR | Status: DC | PRN

## 2016-08-10 NOTE — Anesthesia Pain Management Evaluation Note (Signed)
  CRNA Pain Management Visit Note  Patient: Michelle Guerra, 26 y.o., female  "Hello I am a member of the anesthesia team at [facility]. We have an anesthesia team available at all times to provide care throughout the hospital, including epidural management and anesthesia for C-section. I don't know your plan for the delivery whether it a natural birth, water birth, IV sedation, nitrous supplementation, doula or epidural, but we want to meet your pain goals."   1.Was your pain managed to your expectations on prior hospitalizations?   Yes   2.What is your expectation for pain management during this hospitalization?     Epidural and IV pain meds  3.How can we help you reach that goal? Desires epidural at some point.    Record the patient's initial score and the patient's pain goal.   Pain: 5  Pain Goal: 5 [facility] wants you to be able to say your pain was always managed very well.  [facility] 08/10/2016

## 2016-08-10 NOTE — Anesthesia Procedure Notes (Signed)
Epidural Patient location during procedure: OB  Staffing Anesthesiologist: Chandler Stofer Performed: anesthesiologist   Preanesthetic Checklist Completed: patient identified, pre-op evaluation, timeout performed, IV checked, risks and benefits discussed and monitors and equipment checked  Epidural Patient position: sitting Prep: site prepped and draped and DuraPrep Patient monitoring: heart rate, continuous pulse ox and blood pressure Approach: midline Location: L3-L4 Injection technique: LOR air and LOR saline  Needle:  Needle type: Tuohy  Needle gauge: 17 G Needle length: 9 cm Needle insertion depth: 5 cm Catheter type: closed end flexible Catheter size: 19 Gauge Catheter at skin depth: 11 cm Test dose: negative  Assessment Sensory level: T8 Events: blood not aspirated, injection not painful, no injection resistance, negative IV test and no paresthesia  Additional Notes Reason for block:procedure for pain     

## 2016-08-10 NOTE — H&P (Signed)
OBSTETRIC ADMISSION HISTORY AND PHYSICAL  Michelle Guerra is a 26 y.o. female [phone] with IUP at [redacted]w[redacted]d by 38 week [place] presenting for IOL for preeclampsia w/o severe features. She reports +FMs, No LOF, no VB, no blurry vision, headaches or peripheral edema, and RUQ pain.  She plans on breast feeding. She request BTL (papers signed) for birth control.  Dating: By 33 week [place] --->  Estimated Date of Delivery: 08/31/16  Prenatal History/Complications:  Past Medical History: Past Medical History:  Diagnosis Date  . Anxiety   . Depression   . HPV in female   . Hypertension   . Vaginal Pap smear, abnormal     Past Surgical History: Past Surgical History:  Procedure Laterality Date  . WISDOM TOOTH EXTRACTION      Obstetrical History: OB History    Gravida Para Term Preterm AB Living   SAB TAB Ectopic Multiple Live Births                  Social History: Social History   Social History  . Marital status: Married    Spouse name: N/A  . Number of children: N/A  . Years of education: N/A   Occupational History  . [employer]    Social History Main Topics  . Smoking status: Former Smoker    Types: Cigarettes  . Smokeless tobacco: Never Used  . Alcohol use No  . Drug use: No  . Sexual activity: Yes    Partners: Male    Birth control/ protection: None   Other Topics Concern  . None   Social History Narrative  . None    Family History: Family History  Problem Relation Age of Onset  . Hypertension Mother   . Depression Mother   . Heart disease Father   . Depression Father   . Depression Sister     Allergies: No Known Allergies  Prescriptions Prior to Admission  Medication Sig Dispense Refill Last Dose  . Prenatal Vit-Fe Fumarate-FA (PRENATAL VITAMIN PO) Take by mouth.   Not Taking     Review of Systems   All systems reviewed and negative except as stated in HPI  Blood pressure (!) 134/97, pulse (!) 108, temperature 98.9 F (37.2 C),  temperature source Oral, resp. rate 18, height 5' (1.524 m), weight 60.3 kg (133 lb). General appearance: alert, cooperative and no distress Lungs: clear to auscultation bilaterally Heart: regular rate and rhythm Abdomen: soft, non-tender; bowel sounds normal Extremities: Homans sign is negative, no sign of DVT Presentation: cephalic Fetal monitoringBaseline: 150 bpm, Variability: Good {> 6 bpm), Accelerations: Reactive and Decelerations: Absent Uterine activityFrequency: occasional Dilation: 3 Effacement (%): 50 Station: -2 Exam by:: Kayren Eaves RN    Prenatal labs: ABO, Rh: O/POS/-- (03/26 1018) Antibody: NEG (03/26 1018) Rubella: nonimmune RPR: NON REAC (03/26 1018)  HBsAg: NEGATIVE (03/26 1018)  HIV: NONREACTIVE (03/26 1018)  GBS:   negative 1 hr Glucola in 3rd trimester, normal Genetic screening: too late to prenatal care Anatomy US: 33 week wnl  Prenatal Transfer Tool  Maternal Diabetes: No Genetic Screening: Declined Maternal Ultrasounds/Referrals: Normal Fetal Ultrasounds or other Referrals:  None Maternal Substance Abuse:  No Significant Maternal Medications:  None Significant Maternal Lab Results: None  No results found for this or any previous visit (from the past 24 hour(s)).  Patient Active Problem List   Diagnosis Date Noted  . Preeclampsia 08/10/2016  . Pre-eclampsia 08/02/2016  . Supervision  of other high risk pregnancy, antepartum 07/11/2016  . Late prenatal care 07/11/2016    Assessment: Michelle Guerra is a 26 y.o. G4P3003 at [redacted]w[redacted]d here for IOL for preeclampsia  #Labor:start pitocin, bishop >8 #Pain: Fentanyl, epidural >3cm and upon maternal request #FWB: Category 1 tracing #ID:  GBS negative #MOF: breast #MOC: BTL (papers signed 07/11/16)  Durenda Hurt, MD 08/10/2016, 6:59 PM    I confirm that I have verified the information documented in the resident's note and that I have also personally reperformed the physical exam and all medical  decision making activities.   Tawnya Crook  7:28 PM 08/10/16

## 2016-08-10 NOTE — MAU Note (Signed)
Patient presents with ctx every 5 mins. Patient denies any bleeding or LOF.Fetus active. GBS -

## 2016-08-10 NOTE — Progress Notes (Signed)
Patient ID: Tangie Stay, female   DOB: 01/04/1991, 26 y.o.   MRN: [id number]  Coping well w/ ctx; Pit started recently  BP 136/92, other VSS FHR 130s, +accels, no decels Ctx q 2-3 mins w/ Pit @ 53mu/min Cx 2-3/thick/-2; AROM for clear/pinkish fluid  CBC    Component Value Date/Time   WBC 20.8 (H) 08/10/2016 1800   RBC 4.07 08/10/2016 1800   HGB 12.6 08/10/2016 1800   HCT 35.3 (L) 08/10/2016 1800   PLT 344 08/10/2016 1800   MCV 86.7 08/10/2016 1800   MCH 31.0 08/10/2016 1800   MCHC 35.7 08/10/2016 1800   RDW 13.9 08/10/2016 1800   LYMPHSABS 2,158 07/11/2016 1018   MONOABS 830 07/11/2016 1018   EOSABS 166 07/11/2016 1018   BASOSABS 0 07/11/2016 1018   CMP     Component Value Date/Time   NA 135 08/10/2016 1800   K 2.9 (L) 08/10/2016 1800   CL 105 08/10/2016 1800   CO2 21 (L) 08/10/2016 1800   GLUCOSE 85 08/10/2016 1800   BUN 7 08/10/2016 1800   CREATININE 0.75 08/10/2016 1800   CREATININE 0.80 07/29/2016 0906   CALCIUM 9.8 08/10/2016 1800   PROT 6.0 (L) 08/10/2016 1800   ALBUMIN 2.8 (L) 08/10/2016 1800   AST 20 08/10/2016 1800   ALT 14 08/10/2016 1800   ALKPHOS 129 (H) 08/10/2016 1800   BILITOT 0.5 08/10/2016 1800   GFRNONAA >60 08/10/2016 1800   GFRAA >60 08/10/2016 1800   UDS: neg  IUP@term  Pre-e Hypokalemia Cx favorable  Anticipate increased labor now that water is broken; continue to titrate Pit Urine P/C ratio still pending Will order K+ Anticipate SVD  Cam Hai CNM 08/10/2016 9:39 PM

## 2016-08-10 NOTE — Anesthesia Preprocedure Evaluation (Signed)
Anesthesia Evaluation  Patient identified by MRN, date of birth, ID band Patient awake    Reviewed: Allergy & Precautions, NPO status , Patient's Chart, lab work & pertinent test results  Airway Mallampati: II  TM Distance: >3 FB Neck ROM: Full    Dental no notable dental hx.    Pulmonary neg pulmonary ROS, former smoker,    Pulmonary exam normal breath sounds clear to auscultation       Cardiovascular hypertension, Normal cardiovascular exam Rhythm:Regular Rate:Normal     Neuro/Psych negative neurological ROS  negative psych ROS   GI/Hepatic negative GI ROS, Neg liver ROS,   Endo/Other  negative endocrine ROS  Renal/GU negative Renal ROS     Musculoskeletal negative musculoskeletal ROS (+)   Abdominal   Peds  Hematology negative hematology ROS (+)   Anesthesia Other Findings   Reproductive/Obstetrics (+) Pregnancy                             Anesthesia Physical Anesthesia Plan  ASA: II  Anesthesia Plan: Epidural   Post-op Pain Management:    Induction:   Airway Management Planned:   Additional Equipment:   Intra-op Plan:   Post-operative Plan:   Informed Consent: I have reviewed the patients History and Physical, chart, labs and discussed the procedure including the risks, benefits and alternatives for the proposed anesthesia with the patient or authorized representative who has indicated his/her understanding and acceptance.     Plan Discussed with:   Anesthesia Plan Comments:         Anesthesia Quick Evaluation

## 2016-08-11 ENCOUNTER — Inpatient Hospital Stay (HOSPITAL_COMMUNITY): Payer: Medicaid Other | Admitting: Anesthesiology

## 2016-08-11 ENCOUNTER — Encounter (HOSPITAL_COMMUNITY): Payer: Self-pay | Admitting: Certified Registered Nurse Anesthetist

## 2016-08-11 ENCOUNTER — Encounter (HOSPITAL_COMMUNITY)
Admission: AD | Disposition: A | Discharge status: Home / Self Care | Payer: Self-pay | Source: Ambulatory Visit | Attending: Obstetrics & Gynecology

## 2016-08-11 ENCOUNTER — Encounter (HOSPITAL_COMMUNITY): Payer: Self-pay | Admitting: *Deleted

## 2016-08-11 DIAGNOSIS — O1493 Unspecified pre-eclampsia, third trimester: Secondary | ICD-10-CM | POA: Diagnosis present

## 2016-08-11 DIAGNOSIS — Z302 Encounter for sterilization: Secondary | ICD-10-CM

## 2016-08-11 DIAGNOSIS — O1414 Severe pre-eclampsia complicating childbirth: Secondary | ICD-10-CM

## 2016-08-11 DIAGNOSIS — Z3A37 37 weeks gestation of pregnancy: Secondary | ICD-10-CM

## 2016-08-11 HISTORY — PX: TUBAL LIGATION: SHX77

## 2016-08-11 LAB — RPR: RPR: NONREACTIVE

## 2016-08-11 LAB — ABO/RH: ABO/RH(D): O POS

## 2016-08-11 SURGERY — LIGATION, FALLOPIAN TUBE, POSTPARTUM
Anesthesia: Choice | Laterality: Bilateral

## 2016-08-11 SURGERY — LIGATION, FALLOPIAN TUBE, POSTPARTUM
Anesthesia: Epidural

## 2016-08-11 MED ORDER — FENTANYL CITRATE (PF) 100 MCG/2ML IJ SOLN: 50.0000 ug | INTRAMUSCULAR | Status: DC | PRN

## 2016-08-11 MED ORDER — FENTANYL CITRATE (PF) 100 MCG/2ML IJ SOLN
INTRAMUSCULAR | Status: AC
  Filled 2016-08-11: qty 2

## 2016-08-11 MED ORDER — MEASLES, MUMPS & RUBELLA VAC ~~LOC~~ INJ
0.5000 mL | INJECTION | Freq: Once | SUBCUTANEOUS | Status: DC
Start: 2016-08-11 — End: 2016-08-13
  Filled 2016-08-11: qty 0.5

## 2016-08-11 MED ORDER — LACTATED RINGERS IV SOLN
INTRAVENOUS | Status: DC | PRN
  Administered 2016-08-11 (×2): via INTRAVENOUS

## 2016-08-11 MED ORDER — TETANUS-DIPHTH-ACELL PERTUSSIS 5-2.5-18.5 LF-MCG/0.5 IM SUSP: 0.5000 mL | Freq: Once | INTRAMUSCULAR | Status: DC

## 2016-08-11 MED ORDER — LIDOCAINE-EPINEPHRINE (PF) 2 %-1:200000 IJ SOLN
INTRAMUSCULAR | Status: AC
  Filled 2016-08-11: qty 20

## 2016-08-11 MED ORDER — MIDAZOLAM HCL 5 MG/5ML IJ SOLN
INTRAMUSCULAR | Status: DC | PRN
  Administered 2016-08-11 (×2): 1 mg via INTRAVENOUS

## 2016-08-11 MED ORDER — DEXAMETHASONE SODIUM PHOSPHATE 4 MG/ML IJ SOLN
INTRAMUSCULAR | Status: AC
  Filled 2016-08-11: qty 1

## 2016-08-11 MED ORDER — ONDANSETRON HCL 4 MG/2ML IJ SOLN: 4.0000 mg | INTRAMUSCULAR | Status: DC | PRN

## 2016-08-11 MED ORDER — IBUPROFEN 600 MG PO TABS
600.0000 mg | ORAL_TABLET | Freq: Four times a day (QID) | ORAL | Status: DC
  Administered 2016-08-11 – 2016-08-13 (×9): 600 mg via ORAL
  Filled 2016-08-11 (×10): qty 1

## 2016-08-11 MED ORDER — ONDANSETRON HCL 4 MG/2ML IJ SOLN
INTRAMUSCULAR | Status: AC
  Filled 2016-08-11: qty 2

## 2016-08-11 MED ORDER — ONDANSETRON HCL 4 MG/2ML IJ SOLN: 4.0000 mg | Freq: Four times a day (QID) | INTRAMUSCULAR | Status: DC | PRN

## 2016-08-11 MED ORDER — PRENATAL MULTIVITAMIN CH
1.0000 | ORAL_TABLET | Freq: Every day | ORAL | Status: DC
  Administered 2016-08-12 – 2016-08-13 (×2): 1 via ORAL
  Filled 2016-08-11 (×3): qty 1

## 2016-08-11 MED ORDER — ONDANSETRON HCL 4 MG PO TABS: 4.0000 mg | ORAL_TABLET | ORAL | Status: DC | PRN

## 2016-08-11 MED ORDER — FENTANYL CITRATE (PF) 100 MCG/2ML IJ SOLN: 25.0000 ug | INTRAMUSCULAR | Status: DC | PRN

## 2016-08-11 MED ORDER — BENZOCAINE-MENTHOL 20-0.5 % EX AERO: 1.0000 "application " | INHALATION_SPRAY | CUTANEOUS | Status: DC | PRN

## 2016-08-11 MED ORDER — SODIUM BICARBONATE 8.4 % IV SOLN
INTRAVENOUS | Status: DC | PRN
  Administered 2016-08-11: 3 mL via EPIDURAL
  Administered 2016-08-11 (×4): 5 mL via EPIDURAL

## 2016-08-11 MED ORDER — DIBUCAINE 1 % RE OINT: 1.0000 "application " | TOPICAL_OINTMENT | RECTAL | Status: DC | PRN

## 2016-08-11 MED ORDER — FAMOTIDINE 20 MG PO TABS
40.0000 mg | ORAL_TABLET | Freq: Once | ORAL | Status: AC
  Administered 2016-08-11: 40 mg via ORAL
  Filled 2016-08-11 (×2): qty 2

## 2016-08-11 MED ORDER — BUPIVACAINE HCL (PF) 0.5 % IJ SOLN
INTRAMUSCULAR | Status: AC
  Filled 2016-08-11: qty 30

## 2016-08-11 MED ORDER — SOD CITRATE-CITRIC ACID 500-334 MG/5ML PO SOLN: 30.0000 mL | ORAL | Status: DC | PRN

## 2016-08-11 MED ORDER — WITCH HAZEL-GLYCERIN EX PADS: 1.0000 "application " | MEDICATED_PAD | CUTANEOUS | Status: DC | PRN

## 2016-08-11 MED ORDER — LIDOCAINE HCL (PF) 1 % IJ SOLN
30.0000 mL | INTRAMUSCULAR | Status: DC | PRN
  Filled 2016-08-11: qty 30

## 2016-08-11 MED ORDER — OXYCODONE-ACETAMINOPHEN 5-325 MG PO TABS
1.0000 | ORAL_TABLET | ORAL | Status: DC | PRN
  Administered 2016-08-11 – 2016-08-13 (×4): 1 via ORAL
  Filled 2016-08-11 (×3): qty 1

## 2016-08-11 MED ORDER — ZOLPIDEM TARTRATE 5 MG PO TABS: 5.0000 mg | ORAL_TABLET | Freq: Every evening | ORAL | Status: DC | PRN

## 2016-08-11 MED ORDER — MIDAZOLAM HCL 2 MG/2ML IJ SOLN
INTRAMUSCULAR | Status: AC
  Filled 2016-08-11: qty 2

## 2016-08-11 MED ORDER — ONDANSETRON HCL 4 MG/2ML IJ SOLN
INTRAMUSCULAR | Status: DC | PRN
Start: 2016-08-11 — End: 2016-08-11
  Administered 2016-08-11: 4 mg via INTRAVENOUS

## 2016-08-11 MED ORDER — ACETAMINOPHEN 325 MG PO TABS: 650.0000 mg | ORAL_TABLET | ORAL | Status: DC | PRN

## 2016-08-11 MED ORDER — DEXAMETHASONE SODIUM PHOSPHATE 4 MG/ML IJ SOLN
INTRAMUSCULAR | Status: DC | PRN
  Administered 2016-08-11: 4 mg via INTRAVENOUS

## 2016-08-11 MED ORDER — DIPHENHYDRAMINE HCL 25 MG PO CAPS: 25.0000 mg | ORAL_CAPSULE | Freq: Four times a day (QID) | ORAL | Status: DC | PRN

## 2016-08-11 MED ORDER — PROMETHAZINE HCL 25 MG/ML IJ SOLN
6.2500 mg | INTRAMUSCULAR | Status: DC | PRN
Start: 2016-08-11 — End: 2016-08-13

## 2016-08-11 MED ORDER — KETOROLAC TROMETHAMINE 30 MG/ML IJ SOLN
INTRAMUSCULAR | Status: DC | PRN
  Administered 2016-08-11: 30 mg via INTRAVENOUS

## 2016-08-11 MED ORDER — COCONUT OIL OIL: 1.0000 "application " | TOPICAL_OIL | Status: DC | PRN

## 2016-08-11 MED ORDER — SODIUM BICARBONATE 8.4 % IV SOLN
INTRAVENOUS | Status: AC
Start: 2016-08-11 — End: 2016-08-11
  Filled 2016-08-11: qty 50

## 2016-08-11 MED ORDER — FENTANYL CITRATE (PF) 100 MCG/2ML IJ SOLN
INTRAMUSCULAR | Status: DC | PRN
  Administered 2016-08-11 (×4): 50 ug via INTRAVENOUS

## 2016-08-11 MED ORDER — KETOROLAC TROMETHAMINE 30 MG/ML IJ SOLN
INTRAMUSCULAR | Status: AC
  Filled 2016-08-11: qty 1

## 2016-08-11 MED ORDER — HYDROXYZINE HCL 50 MG PO TABS
50.0000 mg | ORAL_TABLET | Freq: Four times a day (QID) | ORAL | Status: DC | PRN
  Filled 2016-08-11: qty 1

## 2016-08-11 MED ORDER — SODIUM CHLORIDE 0.9 % IR SOLN
Status: DC | PRN
Start: 2016-08-11 — End: 2016-08-11
  Administered 2016-08-11: 1

## 2016-08-11 MED ORDER — METOCLOPRAMIDE HCL 10 MG PO TABS
10.0000 mg | ORAL_TABLET | Freq: Once | ORAL | Status: AC
  Administered 2016-08-11: 10 mg via ORAL
  Filled 2016-08-11: qty 1

## 2016-08-11 MED ORDER — BUPIVACAINE HCL 0.5 % IJ SOLN
INTRAMUSCULAR | Status: DC | PRN
  Administered 2016-08-11: 30 mL

## 2016-08-11 MED ORDER — SENNOSIDES-DOCUSATE SODIUM 8.6-50 MG PO TABS
2.0000 | ORAL_TABLET | ORAL | Status: DC
  Administered 2016-08-11 – 2016-08-12 (×2): 2 via ORAL
  Filled 2016-08-11 (×2): qty 2

## 2016-08-11 MED ORDER — SIMETHICONE 80 MG PO CHEW: 80.0000 mg | CHEWABLE_TABLET | ORAL | Status: DC | PRN

## 2016-08-11 MED ORDER — OXYCODONE-ACETAMINOPHEN 5-325 MG PO TABS
2.0000 | ORAL_TABLET | ORAL | Status: DC | PRN
  Administered 2016-08-11 – 2016-08-13 (×4): 2 via ORAL
  Filled 2016-08-11 (×5): qty 2

## 2016-08-11 MED ORDER — FLEET ENEMA 7-19 GM/118ML RE ENEM: 1.0000 | ENEMA | Freq: Every day | RECTAL | Status: DC | PRN

## 2016-08-11 MED ORDER — LACTATED RINGERS IV SOLN
INTRAVENOUS | Status: DC
  Administered 2016-08-11: 1 mL via INTRAVENOUS

## 2016-08-11 SURGICAL SUPPLY — 23 items
BENZOIN TINCTURE PRP APPL 2/3 (GAUZE/BANDAGES/DRESSINGS) ×3 IMPLANT
CLIP FILSHIE TUBAL LIGA STRL (Clip) ×3 IMPLANT
CLOSURE STERI STRIP 1/2 X4 (GAUZE/BANDAGES/DRESSINGS) ×2 IMPLANT
CLOSURE WOUND 1/2 X4 (GAUZE/BANDAGES/DRESSINGS) ×1
CLOTH BEACON ORANGE TIMEOUT ST (SAFETY) ×3 IMPLANT
DRSG OPSITE POSTOP 3X4 (GAUZE/BANDAGES/DRESSINGS) ×3 IMPLANT
DURAPREP 26ML APPLICATOR (WOUND CARE) ×3 IMPLANT
GLOVE BIOGEL PI IND STRL 7.0 (GLOVE) ×3 IMPLANT
GLOVE BIOGEL PI INDICATOR 7.0 (GLOVE) ×6
GLOVE ECLIPSE 7.0 STRL STRAW (GLOVE) ×3 IMPLANT
GOWN STRL REUS W/TWL LRG LVL3 (GOWN DISPOSABLE) ×6 IMPLANT
GOWN STRL REUS W/TWL XL LVL3 (GOWN DISPOSABLE) ×3 IMPLANT
NEEDLE HYPO 22GX1.5 SAFETY (NEEDLE) ×3 IMPLANT
NS IRRIG 1000ML POUR BTL (IV SOLUTION) ×3 IMPLANT
PACK ABDOMINAL MINOR (CUSTOM PROCEDURE TRAY) ×3 IMPLANT
PROTECTOR NERVE ULNAR (MISCELLANEOUS) ×3 IMPLANT
STRIP CLOSURE SKIN 1/2X4 (GAUZE/BANDAGES/DRESSINGS) ×2 IMPLANT
SUT VIC AB 0 CT1 27 (SUTURE) ×2
SUT VIC AB 0 CT1 27XBRD ANBCTR (SUTURE) ×1 IMPLANT
SUT VIC AB 4-0 PS2 27 (SUTURE) ×3 IMPLANT
SYR CONTROL 10ML LL (SYRINGE) ×3 IMPLANT
TOWEL OR 17X24 6PK STRL BLUE (TOWEL DISPOSABLE) ×6 IMPLANT
TRAY FOLEY CATH 16FR SILVER (SET/KITS/TRAYS/PACK) ×3 IMPLANT

## 2016-08-11 NOTE — Op Note (Signed)
08/10/2016 - 08/11/2016  12:44 PM  PATIENT:  Michelle Guerra  26 y.o. female  PRE-OPERATIVE DIAGNOSIS:  PP BTL  POST-OPERATIVE DIAGNOSIS:  PP BTL-sterilization  PROCEDURE:  Procedure(s): POST PARTUM TUBAL LIGATION (N/A)  SURGEON:  Surgeon(s) and Role:    * Willodean Rosenthal, MD - Primary    * Hiram Comber Mumaw, DO  ANESTHESIA:   epidural  EBL:  Total I/O In: 1000 [I.V.:1000] Out: 105 [Urine:100; Blood:5]  BLOOD ADMINISTERED:none  DRAINS: none   LOCAL MEDICATIONS USED:  MARCAINE     SPECIMEN:  No Specimen  DISPOSITION OF SPECIMEN:  N/A  COUNTS:  YES  TOURNIQUET:  * No tourniquets in log *  DICTATION: .Note written in EPIC  PLAN OF CARE: Pt has active admit order  PATIENT DISPOSITION:  PACU - hemodynamically stable.   Delay start of Pharmacological VTE agent (>24hrs) due to surgical blood loss or risk of bleeding: not applicable  Complications : none immediate  INDICATIONS: 26 y.o. [id number]  with undesired fertility,status post vaginal delivery, desires permanent sterilization.  Other reversible forms of contraception were discussed with patient; she declines all other modalities. Risks of procedure discussed with patient including but not limited to: risk of regret, permanence of method, bleeding, infection, injury to surrounding organs and need for additional procedures.  Failure risk of 3-07/998 with increased risk of ectopic gestation if pregnancy occurs was also discussed with patient.     FINDINGS:  Normal uterus, tubes, and ovaries.  PROCEDURE DETAILS: The patient was taken to the operating room where her epidural anesthesia was dosed up to surgical level and found to be adequate.  She was then placed in the dorsal supine position and prepped and draped in sterile fashion.  After an adequate timeout was performed, attention was turned to the patient's abdomen where a small transverse skin incision was made under the umbilical fold. The incision was taken  down to the layer of fascia using the scalpel, and fascia was incised, and extended bilaterally using Mayo scissors. The peritoneum was entered in a sharp fashion. Attention was then turned to the patient's uterus, and left fallopian tube was identified and followed out to the fimbriated end.  A Filshie clip was placed on the left fallopian tube about 3 cm from the cornual attachment, with care given to incorporate the underlying mesosalpinx.  A similar process was carried out on the right side allowing for bilateral tubal sterilization.  Good hemostasis was noted overall.  The instruments were then removed from the patient's abdomen and the fascial incision was repaired with 0 Vicryl, and the skin was closed with a 4-0 Vicryl subcuticular stitch. The patient tolerated the procedure well.  Instrument, sponge, and needle counts were correct times two.  The patient was then taken to the recovery room awake and in stable condition.  Savon Cobbs L. Harraway-Smith, M.D., [facility]

## 2016-08-11 NOTE — Progress Notes (Signed)
Patient ID: Michelle Guerra, female   DOB: 1990-08-28, 28 y.o.   MRN: [id number] Patient desires surgical management with postpartum bilateral tubal ligation.  I have reviewed with pt that this is a permanent procedure and she should not have it if she is not 100% sure that she does not want further pregnancies. She reports that she has 2 sons and 2 daughters and she is done with childbearing.  The risks of surgery were discussed in detail with the patient including but not limited to: bleeding which may require transfusion or reoperation; infection which may require prolonged hospitalization or re-hospitalization and antibiotic therapy; injury to bowel, bladder, ureters and major vessels or other surrounding organs; need for additional procedures including laparotomy; thromboembolic phenomenon, incisional problems and other postoperative or anesthesia complications.  Patient was told that the likelihood that risk of failure if 3-07/998. There is an increased risk of ectopic pregnancy should a pregnancy occur. The postoperative expectations were also discussed in detail. The patient also understands the alternative treatment options which were discussed in full. All questions were answered.  Proceed to the OR when ready.  Alessa Mazur L. Harraway-Smith, M.D., [facility]

## 2016-08-11 NOTE — Brief Op Note (Signed)
08/10/2016 - 08/11/2016  12:44 PM  PATIENT:  Michelle Guerra  26 y.o. female  PRE-OPERATIVE DIAGNOSIS:  PP BTL  POST-OPERATIVE DIAGNOSIS:  PP BTL-sterilization  PROCEDURE:  Procedure(s): POST PARTUM TUBAL LIGATION (N/A)  SURGEON:  Surgeon(s) and Role:    * Willodean Rosenthal, MD - Primary    * Hiram Comber Mumaw, DO  ANESTHESIA:   epidural  EBL:  Total I/O In: 1000 [I.V.:1000] Out: 105 [Urine:100; Blood:5]  BLOOD ADMINISTERED:none  DRAINS: none   LOCAL MEDICATIONS USED:  MARCAINE     SPECIMEN:  No Specimen  DISPOSITION OF SPECIMEN:  N/A  COUNTS:  YES  TOURNIQUET:  * No tourniquets in log *  DICTATION: .Note written in EPIC  PLAN OF CARE: Pt has active admit order  PATIENT DISPOSITION:  PACU - hemodynamically stable.   Delay start of Pharmacological VTE agent (>24hrs) due to surgical blood loss or risk of bleeding: not applicable  Complications : none immediate  Michelle Guerra, M.D., [facility]

## 2016-08-11 NOTE — Progress Notes (Signed)
UR chart review completed.  

## 2016-08-11 NOTE — Anesthesia Postprocedure Evaluation (Signed)
Anesthesia Post Note  Patient: Clarabel Marion  Procedure(s) Performed: * No procedures listed *  Patient location during evaluation: Mother Baby Anesthesia Type: Epidural Level of consciousness: awake, awake and alert and oriented Pain management: pain level controlled Vital Signs Assessment: post-procedure vital signs reviewed and stable Respiratory status: spontaneous breathing, nonlabored ventilation and respiratory function stable Cardiovascular status: stable Postop Assessment: no headache, no backache, patient able to bend at knees, no signs of nausea or vomiting and adequate PO intake Anesthetic complications: no        Last Vitals:  Vitals:   08/11/16 0515 08/11/16 0630  BP: 129/85 132/85  Pulse: 78 84  Resp: 18 18  Temp: 36.5 C 36.8 C    Last Pain:  Vitals:   08/11/16 0630  TempSrc: Oral  PainSc: 2    Pain Goal: Patients Stated Pain Goal: 0 (08/10/16 1729)               Kathreen Dileo

## 2016-08-11 NOTE — Anesthesia Postprocedure Evaluation (Signed)
Anesthesia Post Note  Patient: Michelle Guerra  Procedure(s) Performed: Procedure(s) (LRB): POST PARTUM TUBAL LIGATION (N/A)  Patient location during evaluation: PACU Anesthesia Type: Epidural Level of consciousness: awake, awake and alert and oriented Pain management: pain level controlled Vital Signs Assessment: post-procedure vital signs reviewed and stable Respiratory status: spontaneous breathing, nonlabored ventilation and respiratory function stable Cardiovascular status: stable Postop Assessment: no headache, no backache, epidural receding, patient able to bend at knees and no signs of nausea or vomiting Anesthetic complications: no        Last Vitals:  Vitals:   08/11/16 1520 08/11/16 1530  BP: 130/76 117/78  Pulse: (!) 108 (!) 115  Resp: 20   Temp: 36.8 C     Last Pain:  Vitals:   08/11/16 1517  TempSrc:   PainSc: 0-No pain   Pain Goal: Patients Stated Pain Goal: 0 (08/10/16 1729)               Cecile Hearing

## 2016-08-11 NOTE — Anesthesia Preprocedure Evaluation (Signed)
Anesthesia Evaluation  Patient identified by MRN, date of birth, ID band Patient awake    Reviewed: Allergy & Precautions, NPO status , Patient's Chart, lab work & pertinent test results  Airway Mallampati: II  TM Distance: >3 FB Neck ROM: Full    Dental no notable dental hx.    Pulmonary neg pulmonary ROS, former smoker,    Pulmonary exam normal breath sounds clear to auscultation       Cardiovascular hypertension, Normal cardiovascular exam Rhythm:Regular Rate:Normal     Neuro/Psych negative neurological ROS  negative psych ROS   GI/Hepatic negative GI ROS, Neg liver ROS,   Endo/Other  negative endocrine ROS  Renal/GU negative Renal ROS     Musculoskeletal negative musculoskeletal ROS (+)   Abdominal   Peds  Hematology negative hematology ROS (+)   Anesthesia Other Findings   Reproductive/Obstetrics s/p svd 08/11/16                             Anesthesia Physical  Anesthesia Plan  ASA: II  Anesthesia Plan: Epidural   Post-op Pain Management:    Induction:   Airway Management Planned:   Additional Equipment:   Intra-op Plan:   Post-operative Plan:   Informed Consent: I have reviewed the patients History and Physical, chart, labs and discussed the procedure including the risks, benefits and alternatives for the proposed anesthesia with the patient or authorized representative who has indicated his/her understanding and acceptance.     Plan Discussed with:   Anesthesia Plan Comments:         Anesthesia Quick Evaluation

## 2016-08-11 NOTE — Lactation Note (Signed)
This note was copied from a baby's chart. Lactation Consultation Note  Patient Name: Michelle Guerra Date: 08/11/2016 Baby at 7 hr of life. RN reports mom has inverted nipples and no longer desire to bf. She was offered a lactation visit and she declined.   Feeding Feeding Type: (P) Bottle Fed - Formula    Consult Status Consult Status: Complete    Rulon Eisenmenger 08/11/2016, 10:31 AM

## 2016-08-11 NOTE — Transfer of Care (Signed)
Immediate Anesthesia Transfer of Care Note  Patient: Michelle Guerra  Procedure(s) Performed: Procedure(s): POST PARTUM TUBAL LIGATION (N/A)  Patient Location: PACU  Anesthesia Type:Epidural  Level of Consciousness: awake, alert  and oriented  Airway & Oxygen Therapy: Patient Spontanous Breathing  Post-op Assessment: Report given to RN and Post -op Vital signs reviewed and stable  Post vital signs: Reviewed and stable   Last Vitals:  Vitals:   08/11/16 0955 08/11/16 1219  BP: 137/82   Pulse: 87   Resp: 20 (P) 16  Temp: 36.6 C (P) 36.9 C    Last Pain:  Vitals:   08/11/16 0954  TempSrc:   PainSc: 2       Patients Stated Pain Goal: 0 (08/10/16 1729)  Complications: No apparent anesthesia complications

## 2016-08-12 ENCOUNTER — Ambulatory Visit (HOSPITAL_COMMUNITY): Payer: Medicaid Other

## 2016-08-12 ENCOUNTER — Encounter (HOSPITAL_COMMUNITY): Payer: Self-pay

## 2016-08-12 ENCOUNTER — Encounter (HOSPITAL_COMMUNITY): Payer: Self-pay | Admitting: Obstetrics & Gynecology

## 2016-08-12 NOTE — Progress Notes (Signed)
CSW acknowledges consult.  CSW attempted to meet with MOB, however MOB was asleep.  CSW will attempt to visit with MOB at a later time.   Teria Khachatryan Boyd-Gilyard, MSW, LCSW Clinical Social Work [phone]  

## 2016-08-12 NOTE — Progress Notes (Signed)
  CLINICAL SOCIAL WORK MATERNAL/CHILD NOTE  Patient Details  Name: Michelle Guerra MRN: [id number] Date of Birth: 08/11/2018  Date:  08/12/2016  Clinical Social Worker Initiating Note:  Dailee Manalang Boyd-Gilyard Date/ Time Initiated:  08/12/16/1435     Child's Name:  Michelle Guerra   Legal Guardian:  Mother (FOB is Blake  Tuite)   Need for Interpreter:  None   Date of Referral:  08/12/16     Reason for Referral:  Late or No Prenatal Care    Referral Source:  Central Nursery   Address:  [address] [place] [address]  Phone number:  [phone]   Household Members:  Minor Children, Spouse   Natural Supports (not living in the home):  Immediate Family, Extended Family, Friends (FOB's immedite and extended family is also a source of support. )   Professional Supports: None   Employment: Full-time   Type of Work: Customer Service   Education:  High school graduate   Financial Resources:  Medicaid   Other Resources:      Cultural/Religious Considerations Which May Impact Care:  None Reported  Strengths:  Ability to meet basic needs , Pediatrician chosen , Home prepared for child , Understanding of illness   Risk Factors/Current Problems:  Mental Health Concerns    Cognitive State:  Alert , Able to Concentrate , Linear Thinking , Insightful , Goal Oriented    Mood/Affect:  Happy , Bright , Interested , Comfortable    CSW Assessment: CSW met with MOB to complete and assessment for hx of anxiety/depression and late PNC.  MOB was receptive to meeting with CSW and was polite and inviting. MOB gave CSW permission to meet with MOB while FOB (Blake Matton) was present. CSW inquired about MOB's MH hx and MOB acknowledged a hx of anxiety and depression.  MOB reported that MOB was diagnosed with anxiety and depression about 2 years ago. MOB state ted that MOB was regulated on medication until about months ago when MOB confirmed MOB was pregnant. Per MOB, MOB has not  experience any signs or symptoms since discontinuing medications.  MOB reports overall feelings good. CSW provided MOB with resource information for outpatient counseling and medication management; MOB was accepting.  CSW educated MOB about PPD and informed MOB of possible supports and interventions to decrease PPD.  CSW also encouraged MOB to seek medical attention if needed for increased signs and symptoms for PPD. CWS inquired about MOB's barriers for prenatal care, and MOB communicated she received PNC routinely once pregnancy was confirmed.  MOB reported that MOB did not have regular cycles and MOB was unaware that MOB was pregnant.  CSW informed MOB and FOB of the hospital's drug screen policy, and informed MOB of the two screenings for the infant. CSW reported to MOB that the infant had a negative UDS, and CSW will follow the infant's CDS.  MOB communicated MOB was not concerned, and denied utilizing any substance during pregnancy. MOB denied and barriers to infant attending well-baby appointment. CSW reviewed safe sleep and SIDS. MOB communicated she is knowledgeable about SIDS and was educated with her older children. MOB did not have any questions or concerns at this time. CSW thanked MOB for allowing CSW to meet with her.  CSW Plan/Description:  Patient/Family Education , No Further Intervention Required/No Barriers to Discharge, Information/Referral to Community Resources    Kahla Risdon Boyd-Gilyard, MSW, LCSW Clinical Social Work [phone]   Jac Romulus D BOYD-GILYARD, LCSW 08/12/2016, 2:52 PM  

## 2016-08-12 NOTE — Progress Notes (Signed)
Post Partum Day 1, Post op day # 1 BTL  Subjective: C/O incisional pain and cramping.   up ad lib, voiding and tolerating PO, small lochia, plans to breastfeed, Had BTL ysterday  Objective: Blood pressure 132/76, pulse 83, temperature 97.3 F (36.3 C), temperature source Oral, resp. rate 18, height 5' (1.524 m), weight 60.3 kg (133 lb), SpO2 99 %, unknown if currently breastfeeding.  Physical Exam:  General: alert, cooperative and no distress Lochia:normal flow Chest: CTAB Heart: RRR no m/r/g Abdomen: +BS, soft, nontender, Incision normal Uterine Fundus: firm DVT Evaluation: No evidence of DVT seen on physical exam. Extremities: no edema   Recent Labs  08/10/16 1800  HGB 12.6  HCT 35.3*    Assessment/Plan: Plan for discharge tomorrow  Try a K pad, oral pain meds   LOS: 2 days   CRESENZO-DISHMAN,Crews Mccollam 08/12/2016, 8:04 AM

## 2016-08-13 MED ORDER — OXYCODONE-ACETAMINOPHEN 5-325 MG PO TABS: 1.0000 | ORAL_TABLET | ORAL | 0 refills | Status: DC | PRN

## 2016-08-13 MED ORDER — IBUPROFEN 600 MG PO TABS: 600.0000 mg | ORAL_TABLET | Freq: Four times a day (QID) | ORAL | 0 refills | Status: AC | PRN

## 2016-08-13 NOTE — Discharge Summary (Signed)
OB Discharge Summary     Patient Name: Michelle Guerra DOB: 1990-10-22 MRN: [id number]  Date of admission: 08/10/2016 Delivering MD: Cam Hai D   Date of discharge: 08/13/2016  Admitting diagnosis: 37w labor eval, ctx 5 min, 3.5 cm, pre-eclampsia desires sterilizatin PP BTL Intrauterine pregnancy: [redacted]w[redacted]d     Secondary diagnosis:  Active Problems:   Supervision of other high risk pregnancy, antepartum   Late prenatal care   Pre-eclampsia   Preeclampsia   Encounter for sterilization   Preeclampsia, third trimester  Additional problems: anxiety and depression     Discharge diagnosis: Term Pregnancy Delivered and Preeclampsia (mild)                                                                                                Post partum procedures:none  Augmentation: AROM and Pitocin  Complications: None  Hospital course:  Induction of Labor With Vaginal Delivery   26 y.o. yo [id number] at [redacted]w[redacted]d was admitted to the hospital 08/10/2016 for induction of labor.  Indication for induction: Preeclampsia.  Patient had an uncomplicated labor course as follows: Membrane Rupture Time/Date: 9:23 PM ,08/10/2016   Intrapartum Procedures: Episiotomy: None [1]                                         Lacerations:  None [1]  Patient had delivery of a Viable infant.  Information for the patient's newborn:  Lucresia, Simic [[id number]]  Delivery Method: Vaginal, Spontaneous Delivery (Filed from Delivery Summary)   08/11/2016  Details of delivery can be found in separate delivery note.  Patient had a routine postpartum course. Patient is discharged home 08/13/16.  Physical exam  Vitals:   08/12/16 0210 08/12/16 0609 08/12/16 1822 08/13/16 0637  BP: 124/74 132/76 122/73 124/76  Pulse: 87 83 87 80  Resp: Temp: 97.9 F (36.6 C) 97.3 F (36.3 C) 98.2 F (36.8 C) 98.1 F (36.7 C)  TempSrc: Oral Oral  Oral  SpO2:      Weight:      Height:       General: alert and  cooperative Lochia: appropriate Uterine Fundus: firm Incision: BTL inc- dsg stained and marked DVT Evaluation: No evidence of DVT seen on physical exam. Labs: Lab Results  Component Value Date   WBC 20.8 (H) 08/10/2016   HGB 12.6 08/10/2016   HCT 35.3 (L) 08/10/2016   MCV 86.7 08/10/2016   PLT 344 08/10/2016   CMP Latest Ref Rng & Units 08/10/2016  Glucose 65 - 99 mg/dL 85  BUN 6 - 20 mg/dL 7  Creatinine 1.30 - 8.65 mg/dL 7.84  Sodium 696 - 295 mmol/L 135  Potassium 3.5 - 5.1 mmol/L 2.9(L)  Chloride 101 - 111 mmol/L 105  CO2 22 - 32 mmol/L 21(L)  Calcium 8.9 - 10.3 mg/dL 9.8  Total Protein 6.5 - 8.1 g/dL 6.0(L)  Total Bilirubin 0.3 - 1.2 mg/dL 0.5  Alkaline Phos 38 - 126 U/L 129(H)  AST 15 -  41 U/L 20  ALT 14 - 54 U/L 14    Discharge instruction: per After Visit Summary and "Baby and Me Booklet".  After visit meds:  Allergies as of 08/13/2016   No Known Allergies     Medication List    TAKE these medications   ibuprofen 600 MG tablet Commonly known as:  ADVIL,MOTRIN Take 1 tablet (600 mg total) by mouth every 6 (six) hours as needed.   oxyCODONE-acetaminophen 5-325 MG tablet Commonly known as:  PERCOCET/ROXICET Take 1 tablet by mouth every 4 (four) hours as needed (for pain scale greater than or equal to 4 and less than 7).   PRENATAL VITAMIN PO Take 1 tablet by mouth at bedtime.       Diet: routine diet  Activity: Advance as tolerated. Pelvic rest for 6 weeks.   Outpatient follow up:6 weeks Follow up Appt:No future appointments. Follow up Visit:No Follow-up on file.  Postpartum contraception: Tubal Ligation  Newborn Data: Live born female  Birth Weight: 6 lb 14.8 oz (3140 g) APGAR: 7, 9  Baby Feeding: Bottle Disposition:home with mother   08/13/2016 Cam Hai, CNM  7:42 AM

## 2016-08-13 NOTE — Discharge Instructions (Signed)
Laparoscopic Tubal Ligation, Care After °Refer to this sheet in the next few weeks. These instructions provide you with information about caring for yourself after your procedure. Your health care provider may also give you more specific instructions. Your treatment has been planned according to current medical practices, but problems sometimes occur. Call your health care provider if you have any problems or questions after your procedure. °What can I expect after the procedure? °After the procedure, it is common to have: °· A sore throat. °· Discomfort in your shoulder. °· Mild discomfort or cramping in your abdomen. °· Gas pains. °· Pain or soreness in the area where the surgical cut (incision) was made. °· A bloated feeling. °· Tiredness. °· Nausea. °· Vomiting. °Follow these instructions at home: °Medicines  °· Take over-the-counter and prescription medicines only as told by your health care provider. °· Do not take aspirin because it can cause bleeding. °· Do not drive or operate heavy machinery while taking prescription pain medicine. °Activity  °· Rest for the rest of the day. °· Return to your normal activities as told by your health care provider. Ask your health care provider what activities are safe for you. °Incision care  ° °· Follow instructions from your health care provider about how to take care of your incision. Make sure you: °¨ Wash your hands with soap and water before you change your bandage (dressing). If soap and water are not available, use hand sanitizer. °¨ Change your dressing as told by your health care provider. °¨ Leave stitches (sutures) in place. They may need to stay in place for 2 weeks or longer. °· Check your incision area every day for signs of infection. Check for: °¨ More redness, swelling, or pain. °¨ More fluid or blood. °¨ Warmth. °¨ Pus or a bad smell. °Other Instructions  °· Do not take baths, swim, or use a hot tub until your health care provider approves. You may take  showers. °· Keep all follow-up visits as told by your health care provider. This is important. °· Have someone help you with your daily household tasks for the first few days. °Contact a health care provider if: °· You have more redness, swelling, or pain around your incision. °· Your incision feels warm to the touch. °· You have pus or a bad smell coming from your incision. °· The edges of your incision break open after the sutures have been removed. °· Your pain does not improve after 2-3 days. °· You have a rash. °· You repeatedly become dizzy or light-headed. °· Your pain medicine is not helping. °· You are constipated. °Get help right away if: °· You have a fever. °· You faint. °· You have increasing pain in your abdomen. °· You have severe pain in one or both of your shoulders. °· You have fluid or blood coming from your sutures or from your vagina. °· You have shortness of breath or difficulty breathing. °· You have chest pain or leg pain. °· You have ongoing nausea, vomiting, or diarrhea. °This information is not intended to replace advice given to you by your health care provider. Make sure you discuss any questions you have with your health care provider. °Document Released: 10/22/2004 Document Revised: 09/07/2015 Document Reviewed: 03/15/2015 °Elsevier Interactive Patient Education © 2017 [employer] °Home Care Instructions for Mom ° ACTIVITY °· Gradually return to your regular activities. °· Let yourself rest. Nap while your baby sleeps. °· Avoid lifting anything that is heavier than 10   lb (4.5 kg) until your health care provider says it is okay. °· Avoid activities that take a lot of effort and energy (are strenuous) until approved by your health care provider. Walking at a slow-to-moderate pace is usually safe. °· If you had a cesarean delivery: °¨ Do not vacuum, climb stairs, or drive a car for 4-6 weeks. °¨ Have someone help you at home until you feel like you can do your usual activities  yourself. °¨ Do exercises as told by your health care provider, if this applies. °VAGINAL BLEEDING °You may continue to bleed for 4-6 weeks after delivery. Over time, the amount of blood usually decreases and the color of the blood usually gets lighter. However, the flow of bright red blood may increase if you have been too active. If you need to use more than one pad in an hour because your pad gets soaked, or if you pass a large clot: °· Lie down. °· Raise your feet. °· Place a cold compress on your lower abdomen. °· Rest. °· Call your health care provider. °If you are breastfeeding, your period should return anytime between 8 weeks after delivery and the time that you stop breastfeeding. If you are not breastfeeding, your period should return 6-8 weeks after delivery. °PERINEAL CARE °The perineal area, or perineum, is the part of your body between your thighs. After delivery, this area needs special care. Follow these instructions as told by your health care provider. °· Take warm tub baths for 15-20 minutes. °· Use medicated pads and pain-relieving sprays and creams as told. °· Do not use tampons or douches until vaginal bleeding has stopped. °· Each time you go to the bathroom: °¨ Use a peri bottle. °¨ Change your pad. °¨ Use towelettes in place of toilet paper until your stitches have healed. °· Do Kegel exercises every day. Kegel exercises help to maintain the muscles that support the vagina, bladder, and bowels. You can do these exercises while you are standing, sitting, or lying down. To do Kegel exercises: °¨ Tighten the muscles of your abdomen and the muscles that surround your birth canal. °¨ Hold for a few seconds. °¨ Relax. °¨ Repeat until you have done this 5 times in a row. °· To prevent hemorrhoids from developing or getting worse: °¨ Drink enough fluid to keep your urine clear or pale yellow. °¨ Avoid straining when having a bowel movement. °¨ Take over-the-counter medicines and stool softeners as  told by your health care provider. °BREAST CARE °· Wear a tight-fitting bra. °· Avoid taking over-the-counter pain medicine for breast discomfort. °· Apply ice to the breasts to help with discomfort as needed: °¨ Put ice in a plastic bag. °¨ Place a towel between your skin and the bag. °¨ Leave the ice on for 20 minutes or as told by your health care provider. °NUTRITION °· Eat a well-balanced diet. °· Do not try to lose weight quickly by cutting back on calories. °· Take your prenatal vitamins until your postpartum checkup or until your health care provider tells you to stop. °POSTPARTUM DEPRESSION °You may find yourself crying for no apparent reason and unable to cope with all of the changes that come with having a newborn. This mood is called postpartum depression. Postpartum depression happens because your hormone levels change after delivery. If you have postpartum depression, get support from your partner, friends, and family. If the depression does not go away on its own after several weeks, contact your health care provider. °  BREAST SELF-EXAM °Do a breast self-exam each month, at the same time of the month. If you are breastfeeding, check your breasts just after a feeding, when your breasts are less full. If you are breastfeeding and your period has started, check your breasts on day 5, 6, or 7 of your period. °Report any lumps, bumps, or discharge to your health care provider. Know that breasts are normally lumpy if you are breastfeeding. This is temporary, and it is not a health risk. °INTIMACY AND SEXUALITY °Avoid sexual activity for at least 3-4 weeks after delivery or until the brownish-red vaginal flow is completely gone. If you want to avoid pregnancy, use some form of birth control. You can get pregnant after delivery, even if you have not had your period. °SEEK MEDICAL CARE IF: °· You feel unable to cope with the changes that a child brings to your life, and these feelings do not go away after  several weeks. °· You notice a lump, a bump, or discharge on your breast. °SEEK IMMEDIATE MEDICAL CARE IF: °· Blood soaks your pad in 1 hour or less. °· You have: °¨ Severe pain or cramping in your lower abdomen. °¨ A bad-smelling vaginal discharge. °¨ A fever that is not controlled by medicine. °¨ A fever, and an area of your breast is red and sore. °¨ Pain or redness in your calf. °¨ Sudden, severe chest pain. °¨ Shortness of breath. °¨ Painful or bloody urination. °¨ Problems with your vision. °· You vomit for 12 hours or longer. °· You develop a severe headache. °· You have serious thoughts about hurting yourself, your child, or anyone else. °This information is not intended to replace advice given to you by your health care provider. Make sure you discuss any questions you have with your health care provider. °Document Released: 04/01/2000 Document Revised: 09/10/2015 Document Reviewed: 10/06/2014 °Elsevier Interactive Patient Education © 2017 [employer] ° °

## 2016-09-06 ENCOUNTER — Ambulatory Visit (INDEPENDENT_AMBULATORY_CARE_PROVIDER_SITE_OTHER): Payer: Medicaid Other | Admitting: Obstetrics & Gynecology

## 2016-09-06 ENCOUNTER — Encounter: Payer: Self-pay | Admitting: Obstetrics & Gynecology

## 2016-09-06 MED ORDER — ESCITALOPRAM OXALATE 10 MG PO TABS: 10.0000 mg | ORAL_TABLET | Freq: Every day | ORAL | 12 refills | Status: AC

## 2016-09-06 NOTE — Progress Notes (Signed)
Post Partum Exam  Michelle Guerra is a 26 y.o. MW  [id number] (9, 916, 502, and 764 weeks old son) female who presents for a postpartum visit. She is 4 weeks postpartum following a spontaneous vaginal delivery. I have fully reviewed the prenatal and intrapartum course. The delivery was at 37 gestational weeks.  Anesthesia: epidural. Postpartum course has been PP depression  Scored 24 on scale. Baby's course has been unremarkable.  Circumcision today. Baby is feeding by bottle - Enfamil Newborn. Bleeding no bleeding. Bowel function is normal. Bladder function is normal. Patient is not sexually active. Contraception method is tubal ligation. Postpartum depression screening:positive. She has been treated in the past for depression and anxiety with celexa and xanax. These were prescribed by her fam doc (when she had insurance). Prior to this pregnancy, she took prozac. She stopped it when she got pregnancy. She says that she is not willing to drive to [place] to see Jaimie. She denies HI/SI.  The following portions of the patient's history were reviewed and updated as appropriate: allergies, current medications, past family history, past medical history, past social history, past surgical history and problem list.  Review of Systems Pertinent items are noted in HPI.    Objective:  Blood pressure (!) 159/91, pulse 73, resp. rate 16, height 5' (1.524 m), weight 118 lb (53.5 kg), not currently breastfeeding.  General:  sad affect   Breasts:  inspection negative, no nipple discharge or bleeding, no masses or nodularity palpable  Lungs: clear to auscultation bilaterally  Heart:  rrr  Abdomen: soft, non-tender; bowel sounds normal; no masses,  no organomegaly   Vulva:  not evaluated  Vagina: not evaluated  Cervix:  not examined  Corpus: not examined  Adnexa:  normal adnexa  Rectal Exam: Not performed.        Assessment:   Normal postpartum exam. Pap smear not done at today's visit. It was normal 3/18.  Plan:    1. Contraception: BTL 2. PP depression- lexapro 10 mg qhs 3. Follow up in: 3 weeks

## 2016-09-22 ENCOUNTER — Ambulatory Visit: Payer: Medicaid Other | Admitting: Obstetrics & Gynecology

## 2016-09-29 ENCOUNTER — Ambulatory Visit: Payer: Medicaid Other | Admitting: Obstetrics & Gynecology

## 2017-08-08 ENCOUNTER — Ambulatory Visit (INDEPENDENT_AMBULATORY_CARE_PROVIDER_SITE_OTHER): Payer: 59 | Admitting: Obstetrics & Gynecology

## 2017-08-08 ENCOUNTER — Encounter: Payer: Self-pay | Admitting: Obstetrics & Gynecology

## 2017-08-08 VITALS — BP 140/95 | HR 102 | Ht 60.0 in | Wt 136.0 lb

## 2017-08-08 DIAGNOSIS — N6310 Unspecified lump in the right breast, unspecified quadrant: Secondary | ICD-10-CM | POA: Diagnosis not present

## 2017-08-08 NOTE — Progress Notes (Signed)
Pt has lump on right breast. Not painful. No nipple discharge. Pt states it has been there for about a month and has gotten bigger.

## 2017-08-08 NOTE — Progress Notes (Signed)
Patient ID: Michelle Guerra, female   DOB: 06-27-1990, 27 y.o.   MRN: [id number]  Chief Complaint  Patient presents with  . Breast Mass    HPI Michelle Guerra is a 27 y.o. female. Married P4 She is here because of a right breast mass, present for about a month, nothing makes it better or worse. Denies nipple discharge. She breast fed briefly. She denies family history of breast cancer. HPI  Past Medical History:  Diagnosis Date  . Anxiety   . Depression   . HPV in female   . Hypertension   . Vaginal Pap smear, abnormal     Past Surgical History:  Procedure Laterality Date  . APPENDECTOMY    . TUBAL LIGATION N/A 08/11/2016   Procedure: POST PARTUM TUBAL LIGATION;  Surgeon: Willodean Rosenthalarolyn Harraway-Smith, MD;  [facility];  Service: Gynecology;  Laterality: N/A;  . WISDOM TOOTH EXTRACTION      Family History  Problem Relation Age of Onset  . Hypertension Mother   . Depression Mother   . Heart disease Father   . Depression Father   . Depression Sister     Social History Social History   Tobacco Use  . Smoking status: Former Smoker    Types: Cigarettes  . Smokeless tobacco: Never Used  Substance Use Topics  . Alcohol use: No  . Drug use: No    No Known Allergies  Current Outpatient Medications  Medication Sig Dispense Refill  . escitalopram (LEXAPRO) 10 MG tablet Take 1 tablet (10 mg total) by mouth daily. 31 tablet 12  . ibuprofen (ADVIL,MOTRIN) 600 MG tablet Take 1 tablet (600 mg total) by mouth every 6 (six) hours as needed. (Patient not taking: Reported on 08/08/2017) 30 tablet 0  . Prenatal Vit-Fe Fumarate-FA (PRENATAL VITAMIN PO) Take 1 tablet by mouth at bedtime.      No current facility-administered medications for this visit.     Review of Systems Review of Systems  Blood pressure (!) 140/95, pulse (!) 102, height 5' (1.524 m), weight 136 lb (61.7 kg), last menstrual period 07/18/2017, not currently breastfeeding.  Physical Exam Physical  Exam Breathing, conversing, and ambulating normally Well nourished, well hydrated White female, no apparent distress Abd- benign About 5 mm right breast firm nodule, very close to the skin. No other abnormalities in either breast Data Reviewed (important suggestion)  Newer results are available. Click to view them now.  Component 649yr ago  Adequacy Satisfactory for evaluation endocervical/transformation zone component PRESENT.   Diagnosis NEGATIVE FOR INTRAEPITHELIAL LESIONS OR MALIGNANCY.   Chlamydia Negative   Comment: Normal Reference Range - Negative  Neisseria gonorrhea Negative   Comment: Normal Reference Range - Negative  Material Submitted CervicoVaginal Pap [ThinPrep Imaged]   Resulting Agency [facility]      Specimen Collected: 07/11/16 00:00 Last Resulted: 07/12/16 00:00       Assessment    Right breast lump- most likely benign    Plan    Check diagnostic mammogram and gen surg consult       Numan Zylstra C Jaxin Fulfer 08/08/2017, 1:28 PM

## 2017-08-09 ENCOUNTER — Encounter: Payer: Self-pay | Admitting: Obstetrics & Gynecology

## 2017-08-09 ENCOUNTER — Telehealth: Payer: Self-pay | Admitting: Obstetrics & Gynecology

## 2017-08-09 NOTE — Telephone Encounter (Signed)
Fax[facility] Order to [facility] in [place] per pt request. Order for diagnostic mamm ultrasound if needed. Fax [phone]
